# Patient Record
Sex: Female | Born: 1940 | Race: White | Hispanic: No | Smoking: Former smoker
Health system: Southern US, Community
[De-identification: ages and names within clinical notes are randomized; demographics above are authoritative.]

## PROBLEM LIST (undated history)

## (undated) DIAGNOSIS — H9190 Unspecified hearing loss, unspecified ear: Secondary | ICD-10-CM

## (undated) DIAGNOSIS — C801 Malignant (primary) neoplasm, unspecified: Secondary | ICD-10-CM

## (undated) DIAGNOSIS — M1990 Unspecified osteoarthritis, unspecified site: Secondary | ICD-10-CM

## (undated) DIAGNOSIS — R51 Headache: Secondary | ICD-10-CM

## (undated) DIAGNOSIS — M5030 Other cervical disc degeneration, unspecified cervical region: Secondary | ICD-10-CM

## (undated) DIAGNOSIS — K760 Fatty (change of) liver, not elsewhere classified: Secondary | ICD-10-CM

## (undated) DIAGNOSIS — H269 Unspecified cataract: Secondary | ICD-10-CM

## (undated) DIAGNOSIS — I10 Essential (primary) hypertension: Secondary | ICD-10-CM

## (undated) HISTORY — PX: CATARACT EXTRACTION, BILATERAL: SHX1313

## (undated) HISTORY — PX: OTHER SURGICAL HISTORY: SHX169

## (undated) HISTORY — PX: BREAST LUMPECTOMY: SHX2

## (undated) HISTORY — PX: COLONOSCOPY: SHX174

## (undated) HISTORY — PX: ABDOMINAL HYSTERECTOMY: SHX81

## (undated) HISTORY — PX: HEMORRHOID SURGERY: SHX153

## (undated) HISTORY — PX: BACK SURGERY: SHX140

---

## 2000-03-17 ENCOUNTER — Encounter: Payer: Self-pay | Admitting: Family Medicine

## 2000-03-17 ENCOUNTER — Encounter: Admission: RE | Admit: 2000-03-17 | Discharge: 2000-03-17 | Payer: Self-pay | Admitting: Family Medicine

## 2000-03-18 ENCOUNTER — Encounter: Payer: Self-pay | Admitting: Family Medicine

## 2000-03-18 ENCOUNTER — Encounter: Admission: RE | Admit: 2000-03-18 | Discharge: 2000-03-18 | Payer: Self-pay | Admitting: Family Medicine

## 2003-01-24 ENCOUNTER — Emergency Department (HOSPITAL_COMMUNITY): Admission: EM | Admit: 2003-01-24 | Discharge: 2003-01-24 | Payer: Self-pay | Admitting: Emergency Medicine

## 2003-03-23 ENCOUNTER — Ambulatory Visit (HOSPITAL_COMMUNITY): Admission: RE | Admit: 2003-03-23 | Discharge: 2003-03-23 | Payer: Self-pay | Admitting: Family Medicine

## 2004-01-28 ENCOUNTER — Ambulatory Visit (HOSPITAL_COMMUNITY): Admission: RE | Admit: 2004-01-28 | Discharge: 2004-01-28 | Payer: Self-pay | Admitting: Gastroenterology

## 2005-09-06 ENCOUNTER — Encounter: Admission: RE | Admit: 2005-09-06 | Discharge: 2005-09-06 | Payer: Self-pay | Admitting: Family Medicine

## 2005-09-07 ENCOUNTER — Encounter: Admission: RE | Admit: 2005-09-07 | Discharge: 2005-09-07 | Payer: Self-pay | Admitting: Family Medicine

## 2006-01-18 ENCOUNTER — Inpatient Hospital Stay (HOSPITAL_COMMUNITY): Admission: RE | Admit: 2006-01-18 | Discharge: 2006-01-24 | Payer: Self-pay | Admitting: Neurosurgery

## 2006-07-19 ENCOUNTER — Ambulatory Visit (HOSPITAL_COMMUNITY): Admission: RE | Admit: 2006-07-19 | Discharge: 2006-07-19 | Payer: Self-pay | Admitting: Neurosurgery

## 2006-11-16 ENCOUNTER — Encounter: Admission: RE | Admit: 2006-11-16 | Discharge: 2006-11-16 | Payer: Self-pay | Admitting: Family Medicine

## 2007-09-19 IMAGING — CR DG CERVICAL SPINE 1V
1 series · 1 of 1 positions shown · non-contrast
Comparison: Intraoperative exam performed 01/18/06.

CLINICAL DATA: Postoperative pain.  
LATERAL VIEW OF THE CERVICAL SPINE ? 01/20/06:

[view not recorded]
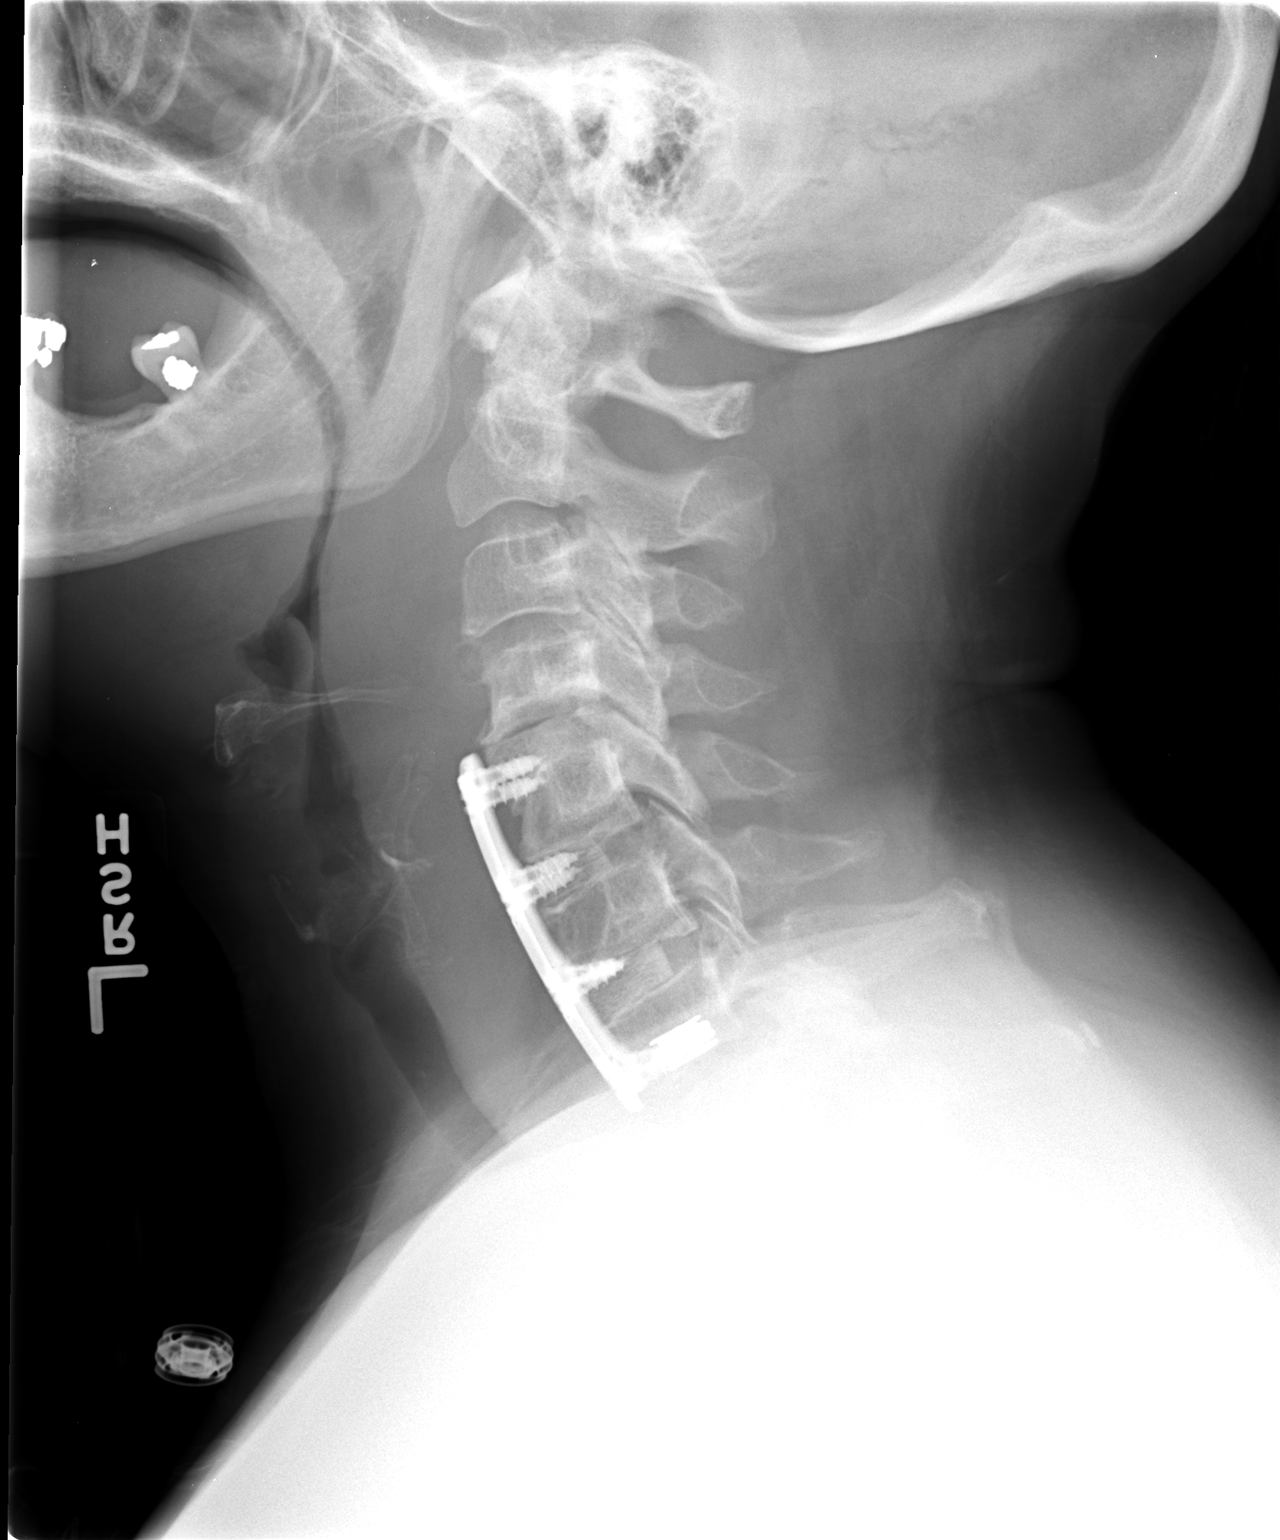

[1 of 1 positions shown; findings below may reference images not displayed]

FINDINGS: Interbody bony plug has been placed at the C4-5, C5-6 and C6-7 level.  There is an anterior plate and screws in place spanning from the C4-5 to the lower C7 level.  The upper screws traverse through the interbody bony plug at the C4-5 level and the upper aspect of the C5 vertebra.  The middle screws traverse the C5-6 bony plug and C6-7 bony plug/inferior C6 end plate.  Lower screws covered by overlying metallic structure but appear to be in the inferior aspect of C7 vertebra.  
Marked prevertebral soft tissue swelling measuring 2.9 cm at the C3 level and 2.2 cm at the C6 level.  This is compressing the adjacent hypopharynx.  This is suggestive of postoperative hemorrhage and/or infection.  Results discussed with Dr. Jeanmichel at the time of imaging.
IMPRESSION: Postsurgical changes C4 to C7 as described above with prominent prevertebral soft tissue swelling.  Question postoperative hemorrhage versus infection?

## 2010-06-19 NOTE — H&P (Signed)
Sierra [name], Sierra [name] NO.:  [id number]   MEDICAL RECORD NO.:  [id number]          PATIENT TYPE:  INP   LOCATION:  3013                         FACILITY:  [facility]   PHYSICIAN:  Payton Doughty, M.D.      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   HISTORY OF PRESENT ILLNESS:  A 70 year old right-handed white lady, who  starting in the August had the onset of neck, left shoulder, and arm  pain, numbness in the ring finger and left hand, persistent until now.  MR demonstrates spondylytic disease and she is admitted for a anterior  decompression effusion.   PAST MEDICAL HISTORY:  Remarkable for posterior decompression by Dr.  Jerline Pain in 1978 at C5-6 and C6-7.  Medical history is benign.   MEDICATIONS:  1. She uses Elavil 50 mg at night.  2. Zocor 40 mg a day.  3. Inderal 80 mg a day for migraines.  4. Vicodin on a p.r.n. basis.  5. Flexeril on p.r.n. basis.  6. Etodolac 400 mg b.i.d. for pain.   ALLERGIES:  None.   PAST SURGICAL HISTORY:  Includes also 2 lumbar disks.   SOCIAL HISTORY:  Does not smoke, does not drink, and retired from [employer].   FAMILY HISTORY:  Mom is 70, in good health.  Father's history is not  given.   REVIEW OF SYSTEMS:  Remarkable for tenderness, balance disturbance, arm  weakness, arm pain, neck pain, and fainting spells.   PHYSICAL EXAMINATION:  HEENT:  Within normal limits.  NECK:  She has limited range of motion of her neck, which causes  discomfort.  CHEST:  Clear.  CARDIAC:  Regular rate and rhythm.  ABDOMEN:  Nontender.  No hepatosplenomegaly.  EXTREMITIES:  Without clubbing or cyanosis.  Peripheral pulses are good.  GU:  Deferred.  NEUROLOGIC:  She is awake, alert, and oriented.  Her cranial nerves are  intact.  She has episodic give-way weakness.  Basically has  full  strength in the upper extremities, save for left triceps, it is 5/-5.  Sensory deficit described in the left C8 distribution.  Reflexes are  absent in the upper extremities and nonmonopathic in the lower.  Paulene Floor' is negative.   MRI and plain films shows spondylytic disease with a slight swollen neck  deformity at 4-5, 5-6 and 6-7.  On sagittal images on the left side,  there is significant spur at 6-7, slightly smaller one at 5-6.   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  Because of swan-neck deformity, degenerative change at the C3 levels, 4-  5, 5-6-, and 6-7, would need to be decompressed and fused, the goal  being to get her back in lordosis.  The risks and benefits of this  approach have discussed with her and she wished to proceed.           ______________________________  Payton Doughty, M.D.  MWR/MEDQ  D:  01/18/2006  T:  01/18/2006  Job:  [id number]

## 2010-06-19 NOTE — Discharge Summary (Signed)
NAMESUJATA, Sierra [name]                   ACCOUNT NO.:  [id number]   MEDICAL RECORD NO.:  [id number]          PATIENT TYPE:  INP   LOCATION:  3013                         FACILITY:  [facility]   PHYSICIAN:  Coletta Memos, M.D.     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   PROCEDURES:  1. Anterior cervical decompression C4-5, C7 done January 18, 2006 by      Dr. Trey Sailors.  2. Cervical wound exploration secondary to neck hematoma on January 20, 2006.   [name] was admitted on January 18, 2006 where she underwent an  anterior cervical decompression and arthrodesis secondary to pain that  she had in the neck, left shoulder, arm pain and numbness in the ring  finger of the left hand which was persistent.  MRI showed compression  and spondylosis.  She had a left C7 radiculopathy.  She also has swan  neck deformity and degenerative changes at three levels.  It was felt  that she should undergo an anterior procedure which she did.  Postoperatively, she did well but did have a neck hematoma, had some  problems swallowing, had some problems with the anxiousness when  breathing.  She was taken back to the operating room on the 20th and  underwent a cervical exploration for hematoma removal.  Postoperatively,  she is doing very well.  Her pain is improved.  She is swallowing  better.  Her wound is clean, dry and no signs of infection.  She had a  drain removed on Sunday, January 23, 2006.  She will be discharged  home.           ______________________________  Coletta Memos, M.D.     KC/MEDQ  D:  01/24/2006  T:  01/24/2006  Job:  [id number]

## 2010-06-19 NOTE — Op Note (Signed)
Sierra [name], Sierra [name] NO.:  [id number]   MEDICAL RECORD NO.:  [id number]          PATIENT TYPE:  INP   LOCATION:  3013                         FACILITY:  [facility]   PHYSICIAN:  Payton Doughty, M.D.      DATE OF BIRTH:  [birth year]   DATE OF PROCEDURE:  01/20/2006  DATE OF DISCHARGE:                               OPERATIVE REPORT   PREOPERATIVE DIAGNOSIS:  Neck mass.   POSTOPERATIVE DIAGNOSIS:  Small neck hematoma.   SURGEON:  Payton Doughty, M.D.   ANESTHESIA:  General endotracheal. Prepped with Betadine and scrubbed  with alcohol wipe.   COMPLICATIONS:  None.   PROCEDURE:  Exploration of anterior cervical decompression and fusion.   INDICATIONS FOR PROCEDURE:  This is a 70 year old girl who had a CDF  done 2 days ago and had complaints of dysphagia and lateral C-spine x-  ray demonstrated some neck mass.   DESCRIPTION OF PROCEDURE:  Taken to the operating room, smoothly  anesthetized and intubated, placed supine on the operating table.  Following shave, prep, draped in the usual sterile fashion.  The old  skin incision was reopened.  The exploration down to the prevertebral  space revealed a small amount of hematoma.  It was evacuated.  It was  not solid.  Wound was irrigated.  Hemostasis assured.  The esophagus was  carefully inspected and found to have no new lesions.  There was some  swelling of the hypopharynx.  The wound was copiously irrigated.  Hemostasis once again assured and the platysma and subcutaneous tissues  were reapproximated with 3-0 Vicryl interrupted fashion.  The skin was  closed with 4-0 nylon in simple running fashion.  Betadine Telfa  dressing was applied and made occlusive with OpSite.  The patient  returned to recovery room in good condition.  A Jackson-Pratt drain was  placed in prevertebral space and exited by separate incision.  Betadine  Telfa dressing was applied and the patient placed back in an Aspen  collar and returned to  recovery room in good condition.           ______________________________  Payton Doughty, M.D.     MWR/MEDQ  D:  01/20/2006  T:  01/21/2006  Job:  [id number]

## 2010-06-19 NOTE — Op Note (Signed)
NAMEDEYNA, CARBON NO.:  [id number]   MEDICAL RECORD NO.:  [id number]          PATIENT TYPE:  INP   LOCATION:  2899                         FACILITY:  [facility]   PHYSICIAN:  Payton Doughty, M.D.      DATE OF BIRTH:  26-Mar-1940   DATE OF PROCEDURE:  01/18/2006  DATE OF DISCHARGE:                               OPERATIVE REPORT   PREOPERATIVE DIAGNOSIS:  Spondylosis, C4-5, C5-6, C6-7.   POSTOPERATIVE DIAGNOSIS:  Spondylosis, C4-5, C5-6, C6-7.   PROCEDURE:  C4-5, C5-6, C6-7 anterior cervical decompression and fusion  with a Reflex Hybrid plate.   SURGEON:  Payton Doughty, M.D.   ANESTHESIA:  General endotracheal.   __________  Alcohol wipe.   COMPLICATIONS:  None.   NURSE ASSISTANT:  [place].   DOCTOR ASSISTANT:  Hewitt Shorts, M.D.   DESCRIPTION OF PROCEDURE:  This is a 70 year old girl with severe  cervical spondylosis.  Taken to operating room, smoothly __________  intubated and placed supine on the operating table with the neck  extended in the halter head traction.  Following shave, prep, and drape  in the usual sterile fashion, the skin was incised paralleling the  sternocleidomastoid muscle starting 2 fingerbreadths below the carotid  tubercle for a distance of about 8 cm.  The platysma was identified,  elevated, divided, and undermined.  The sternocleidomastoid was  identified.  Medial dissection revealed the carotid artery tracked  laterally to the left.  The trachea and esophagus were retracted  laterally to the right, exposing the bones of the anterior cervical  spine.  A marker was placed.  Intraoperative x-ray obtained to confirm  correctness of level.  After confirming correctness of level, diskectomy  was carried out at 4-5, 5-6, and 6-7 under gross observation.  The  operating microscope was then brought in.  We used microdissection  technique to remove the bone spur, divide the posterior longitudinal  ligament, and explore the neural  foramina bilaterally.  5-6 was the  worst, 6-7 was second worst, and 4-5 was the least affected, although  significantly encumbered.  Following complete decompression and  exploration of both neural foramen at each level, a 7-mm bone graft was  fashioned from patellar allograft and tapped into place.  A 51-mm Reflex  Hybrid plate was then placed with two screws in C4,  two in C5, two C6,  and two in C7.  The screws were 12 mm.  Intraoperative x-ray showed good  placement of bone grafts, plate, and screws.  The wound was irrigated.  Hemostasis assured.  A 7-mm Jackson-Pratt drain was placed in  the prevertebral space.  A 3-0 Vicryl was used to close the platysma and  subcutaneous tissue, and the skin was closed with 4-0 Vicryl in running  subcuticular fashion.  Benzoin and Steri-Strips were placed with  occlusive Telfa and OpSite, and the patient was placed in an Aspen  collar and returned to the recovery room in good condition.  ,           ______________________________  Payton Doughty, M.D.  MWR/MEDQ  D:  01/18/2006  T:  01/18/2006  Job:  [id number]

## 2011-02-25 DIAGNOSIS — M25519 Pain in unspecified shoulder: Secondary | ICD-10-CM | POA: Diagnosis not present

## 2011-02-25 DIAGNOSIS — Z23 Encounter for immunization: Secondary | ICD-10-CM | POA: Diagnosis not present

## 2011-02-25 DIAGNOSIS — B009 Herpesviral infection, unspecified: Secondary | ICD-10-CM | POA: Diagnosis not present

## 2011-03-30 DIAGNOSIS — M25519 Pain in unspecified shoulder: Secondary | ICD-10-CM | POA: Diagnosis not present

## 2011-03-31 DIAGNOSIS — M25519 Pain in unspecified shoulder: Secondary | ICD-10-CM | POA: Diagnosis not present

## 2011-04-09 DIAGNOSIS — M25519 Pain in unspecified shoulder: Secondary | ICD-10-CM | POA: Diagnosis not present

## 2011-04-16 DIAGNOSIS — M545 Low back pain: Secondary | ICD-10-CM | POA: Diagnosis not present

## 2011-04-16 DIAGNOSIS — Z23 Encounter for immunization: Secondary | ICD-10-CM | POA: Diagnosis not present

## 2011-04-16 DIAGNOSIS — E7800 Pure hypercholesterolemia, unspecified: Secondary | ICD-10-CM | POA: Diagnosis not present

## 2011-04-16 DIAGNOSIS — Z13220 Encounter for screening for lipoid disorders: Secondary | ICD-10-CM | POA: Diagnosis not present

## 2011-04-16 DIAGNOSIS — Z136 Encounter for screening for cardiovascular disorders: Secondary | ICD-10-CM | POA: Diagnosis not present

## 2011-04-16 DIAGNOSIS — E119 Type 2 diabetes mellitus without complications: Secondary | ICD-10-CM | POA: Diagnosis not present

## 2011-04-16 DIAGNOSIS — M25519 Pain in unspecified shoulder: Secondary | ICD-10-CM | POA: Diagnosis not present

## 2011-04-16 DIAGNOSIS — I10 Essential (primary) hypertension: Secondary | ICD-10-CM | POA: Diagnosis not present

## 2011-05-11 DIAGNOSIS — M25519 Pain in unspecified shoulder: Secondary | ICD-10-CM | POA: Diagnosis not present

## 2011-05-14 DIAGNOSIS — M25519 Pain in unspecified shoulder: Secondary | ICD-10-CM | POA: Diagnosis not present

## 2011-05-18 DIAGNOSIS — M25519 Pain in unspecified shoulder: Secondary | ICD-10-CM | POA: Diagnosis not present

## 2011-05-21 DIAGNOSIS — M25519 Pain in unspecified shoulder: Secondary | ICD-10-CM | POA: Diagnosis not present

## 2011-05-25 DIAGNOSIS — M25519 Pain in unspecified shoulder: Secondary | ICD-10-CM | POA: Diagnosis not present

## 2011-05-28 DIAGNOSIS — M25519 Pain in unspecified shoulder: Secondary | ICD-10-CM | POA: Diagnosis not present

## 2011-06-02 ENCOUNTER — Other Ambulatory Visit: Payer: Self-pay | Admitting: Surgical

## 2011-06-02 MED ORDER — BUPIVACAINE LIPOSOME 1.3 % IJ SUSP: 20.0000 mL | Freq: Once | INTRAMUSCULAR | Status: DC

## 2011-06-03 MED ORDER — FENTANYL CITRATE 0.05 MG/ML IJ SOLN
INTRAMUSCULAR | Status: AC
  Filled 2011-06-03: qty 2

## 2011-06-03 MED ORDER — ACETAMINOPHEN 10 MG/ML IV SOLN
INTRAVENOUS | Status: AC
  Filled 2011-06-03: qty 100

## 2011-06-11 ENCOUNTER — Encounter (HOSPITAL_COMMUNITY): Payer: Self-pay | Admitting: Pharmacy Technician

## 2011-06-17 ENCOUNTER — Encounter (HOSPITAL_COMMUNITY): Payer: Self-pay

## 2011-06-17 ENCOUNTER — Ambulatory Visit (HOSPITAL_COMMUNITY)
Admission: RE | Admit: 2011-06-17 | Discharge: 2011-06-17 | Disposition: A | Discharge status: Home / Self Care | Payer: Medicare Other | Source: Ambulatory Visit | Attending: Orthopedic Surgery | Admitting: Orthopedic Surgery

## 2011-06-17 ENCOUNTER — Encounter (HOSPITAL_COMMUNITY)
Admission: RE | Admit: 2011-06-17 | Discharge: 2011-06-17 | Disposition: A | Discharge status: Home / Self Care | Payer: Medicare Other | Source: Ambulatory Visit | Attending: Orthopedic Surgery | Admitting: Orthopedic Surgery

## 2011-06-17 DIAGNOSIS — M25819 Other specified joint disorders, unspecified shoulder: Secondary | ICD-10-CM | POA: Insufficient documentation

## 2011-06-17 DIAGNOSIS — S43429A Sprain of unspecified rotator cuff capsule, initial encounter: Secondary | ICD-10-CM | POA: Diagnosis not present

## 2011-06-17 DIAGNOSIS — X58XXXA Exposure to other specified factors, initial encounter: Secondary | ICD-10-CM | POA: Insufficient documentation

## 2011-06-17 DIAGNOSIS — Z01811 Encounter for preprocedural respiratory examination: Secondary | ICD-10-CM | POA: Diagnosis not present

## 2011-06-17 DIAGNOSIS — Z01810 Encounter for preprocedural cardiovascular examination: Secondary | ICD-10-CM | POA: Diagnosis not present

## 2011-06-17 DIAGNOSIS — Z01812 Encounter for preprocedural laboratory examination: Secondary | ICD-10-CM | POA: Diagnosis not present

## 2011-06-17 DIAGNOSIS — J984 Other disorders of lung: Secondary | ICD-10-CM | POA: Insufficient documentation

## 2011-06-17 DIAGNOSIS — Z01818 Encounter for other preprocedural examination: Secondary | ICD-10-CM | POA: Insufficient documentation

## 2011-06-17 HISTORY — DX: Malignant (primary) neoplasm, unspecified: C80.1

## 2011-06-17 HISTORY — DX: Unspecified osteoarthritis, unspecified site: M19.90

## 2011-06-17 HISTORY — DX: Essential (primary) hypertension: I10

## 2011-06-17 HISTORY — DX: Headache: R51

## 2011-06-17 LAB — BASIC METABOLIC PANEL
Calcium: 8.9 mg/dL (ref 8.4–10.5)
GFR calc Af Amer: 72 mL/min — ABNORMAL LOW (ref >90)
GFR calc non Af Amer: 62 mL/min — ABNORMAL LOW (ref >90)
Glucose, Bld: 123 mg/dL — ABNORMAL HIGH (ref 70–99)
Potassium: 4.8 mEq/L (ref 3.5–5.1)
Sodium: 144 mEq/L (ref 135–145)

## 2011-06-17 LAB — URINALYSIS, ROUTINE W REFLEX MICROSCOPIC
Bilirubin Urine: NEGATIVE
Glucose, UA: NEGATIVE mg/dL
Hgb urine dipstick: NEGATIVE
Nitrite: POSITIVE — AB
Protein, ur: NEGATIVE mg/dL
Specific Gravity, Urine: 1.027 (ref 1.005–1.030)
Urobilinogen, UA: 1 mg/dL (ref 0.0–1.0)
pH: 7.5 (ref 5.0–8.0)

## 2011-06-17 LAB — CBC
Hemoglobin: 13.4 g/dL (ref 12.0–15.0)
MCH: 30.5 pg (ref 26.0–34.0)
Platelets: 278 10*3/uL (ref 150–400)
RBC: 4.4 MIL/uL (ref 3.87–5.11)

## 2011-06-17 LAB — URINE MICROSCOPIC-ADD ON

## 2011-06-17 LAB — SURGICAL PCR SCREEN: MRSA, PCR: NEGATIVE

## 2011-06-17 MED ORDER — CEFAZOLIN SODIUM 1-5 GM-% IV SOLN: 1.0000 g | INTRAVENOUS | Status: DC

## 2011-06-17 NOTE — Patient Instructions (Signed)
20 Sierra [name]  06/17/2011   Your procedure is scheduled on:  06/23/11 1045am-1215pm  Report to [facility] at 0815 AM.  Call this number if you have problems the morning of surgery: [phone]   Remember:   Do not eat food:After Midnight.  May have clear liquids:until Midnight .   Take these medicines the morning of surgery with A SIP OF WATER   Do not wear jewelry, make-up or nail polish.  Do not wear lotions, powders, or perfumes.   Do not shave 48 hours prior to surgery.  Do not bring valuables to the hospital.  Contacts, dentures or bridgework may not be worn into surgery.  Leave suitcase in the car. After surgery it may be brought to your room.  For patients admitted to the hospital, checkout time is 11:00 AM the day of discharge.    Special Instructions: CHG Shower Use Special Wash: 1/2 bottle night before surgery and 1/2 bottle morning of surgery. shower chin to toes with CHG.  Wash face and private parts with regular soap.    Please read over the following fact sheets that you were given: MRSA Information, Incentive Spirometry Fact Sheet, coughing and deep breathing exercise, leg exercises

## 2011-06-17 NOTE — Pre-Procedure Instructions (Signed)
06/17/11 Faxed note for Dr Darrelyn Hillock to note abnormal CXR  and U/A with micro results. Confirmation received by fax.

## 2011-06-18 ENCOUNTER — Other Ambulatory Visit: Payer: Self-pay | Admitting: Orthopedic Surgery

## 2011-06-18 DIAGNOSIS — M25519 Pain in unspecified shoulder: Secondary | ICD-10-CM

## 2011-06-21 NOTE — H&P (Signed)
Sierra [name] DOB: 02-08-1940  Chief Complaint: right shoulder pain  History of Present Illness The patient is a 71 year old female who is scheduled for an open right rotator cuff repair with acromionectomy with possible closed manipulation by Dr. Darrelyn Hillock at [facility] on Wednesday Jun 23, 2011. The patient is being followed for their right shoulder impingement. They are 15 weeks out from when symptoms began. She was helping her aunt reposition when she felt that her aunt was going to fall. When she tried to catch her, she felt some immediate pain in the right shoulder that radiated to the right arm. The patient denied numbness and tingling in the right arm at this point. She denied neck pain. She has a history of cervical fusion that was performed years ago. The patient has seen her primary care physician Dr. Clovis Riley for this. He placed her on an antiinflammatory and put her into a sling which she states has helped some but she was still in severe pain. MRI showed impingement with partial rotator cuff tear. Conservative treatment was given including therapy and cortisone injections but she continued to have pain.    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    Family History Hypertension. mother and father   Social History Living situation. live alone Marital status. widowed Illicit drug use. no Drug/Alcohol Rehab (Previously). no Exercise. Exercises daily; does running / walking Tobacco / smoke exposure. no Tobacco use. former smoker; smoke(d) 3 or more pack(s) per day Current work status. retired Aeronautical engineer. yes Alcohol use. current drinker; only occasionally per week Drug/Alcohol Rehab (Currently). no Children. 3   Pregnancy / Birth History Pregnant. no   Past Surgical History Neck Disc  Surgery Spinal Surgery Appendectomy Hemorrhoidectomy Hysterectomy. partial (non-cancerous)   Review of Systems General:Not Present- Chills, Fever, Night Sweats, Appetite Loss, Fatigue, Feeling sick, Weight Gain and Weight Loss. Skin:Not Present- Itching, Rash, Skin Color Changes, Ulcer, Psoriasis and Change in Hair or Nails. HEENT:Present- Ringing in the Ears. Not Present- Sensitivity to light, Hearing problems and Nose Bleed. Neck:Not Present- Swollen Glands and Neck Mass. Respiratory:Not Present- Snoring, Chronic Cough, Bloody sputum and Dyspnea. Cardiovascular:Not Present- Shortness of Breath, Chest Pain, Swelling of Extremities, Leg Cramps and Palpitations. Gastrointestinal:Not Present- Bloody Stool, Heartburn, Abdominal Pain, Vomiting, Nausea and Incontinence of Stool. Female Genitourinary:Not Present- Blood in Urine, Menstrual Irregularities, Frequency, Incontinence and Nocturia. Musculoskeletal:Not Present- Muscle Weakness, Muscle Pain, Joint Stiffness, Joint Swelling, Joint Pain and Back Pain. Neurological:Not Present- Tingling, Numbness, Burning, Tremor, Headaches and Dizziness. Psychiatric:Not Present- Anxiety, Depression and Memory Loss. Endocrine:Not Present- Cold Intolerance, Heat Intolerance, Excessive hunger and Excessive Thirst. Hematology:Not Present- Abnormal Bleeding, Anemia, Blood Clots and Easy Bruising.   Vitals Weight: 185 lb Height: 66.75 in Body Surface Area: 1.99 m Body Mass Index: 29.19 kg/m Pulse: 83 (Regular) BP: 159/90   Physical Exam She is alert and oriented in no acute distress. She is tender over the anterior portion of the shoulder. The [facility] joint is intact. No tenderness over the scapula or the cervical spine. No masses or tumors palpated. Mild pain with cervical ROM. The patient has very limited range of motion about 30 degrees of flexion and abduction and she is unable to hold this against resistance. Normal sensation  and circulation in the upper extremities. Elbows have normal painless range of motion as well as the hands and fingers. The left shoulder has normal  painless range of motion. Lungs clear to auscultation. Heart normal sinus rhythm with no murmur. Oral cavity is negative. Neck supple, no bruit. Abdomen soft, nontender. Bowel sounds active.    Assessment and Plans Right rotator cuff tear and shoulder impingement Right shoulder rotator cuff repair with possible graft, open acromionectomy, closed manipulation.       Dimitri Ped, PA-C

## 2011-06-22 ENCOUNTER — Ambulatory Visit
Admission: RE | Admit: 2011-06-22 | Discharge: 2011-06-22 | Disposition: A | Discharge status: Home / Self Care | Payer: Medicare Other | Source: Ambulatory Visit | Attending: Orthopedic Surgery | Admitting: Orthopedic Surgery

## 2011-06-22 DIAGNOSIS — M25519 Pain in unspecified shoulder: Secondary | ICD-10-CM

## 2011-06-22 DIAGNOSIS — J9819 Other pulmonary collapse: Secondary | ICD-10-CM | POA: Diagnosis not present

## 2011-06-22 DIAGNOSIS — J984 Other disorders of lung: Secondary | ICD-10-CM | POA: Diagnosis not present

## 2011-06-22 DIAGNOSIS — R918 Other nonspecific abnormal finding of lung field: Secondary | ICD-10-CM | POA: Diagnosis not present

## 2011-06-23 ENCOUNTER — Encounter (HOSPITAL_COMMUNITY): Payer: Self-pay | Admitting: Registered Nurse

## 2011-06-23 ENCOUNTER — Ambulatory Visit (HOSPITAL_COMMUNITY)
Admission: RE | Admit: 2011-06-23 | Discharge: 2011-06-24 | Disposition: A | Discharge status: Home / Self Care | Payer: Medicare Other | Source: Ambulatory Visit | Attending: Orthopedic Surgery | Admitting: Orthopedic Surgery

## 2011-06-23 ENCOUNTER — Ambulatory Visit (HOSPITAL_COMMUNITY): Payer: Medicare Other | Admitting: Registered Nurse

## 2011-06-23 ENCOUNTER — Encounter (HOSPITAL_COMMUNITY): Payer: Self-pay

## 2011-06-23 ENCOUNTER — Encounter (HOSPITAL_COMMUNITY)
Admission: RE | Disposition: A | Discharge status: Home / Self Care | Payer: Self-pay | Source: Ambulatory Visit | Attending: Orthopedic Surgery

## 2011-06-23 DIAGNOSIS — E7800 Pure hypercholesterolemia, unspecified: Secondary | ICD-10-CM | POA: Insufficient documentation

## 2011-06-23 DIAGNOSIS — E119 Type 2 diabetes mellitus without complications: Secondary | ICD-10-CM | POA: Diagnosis not present

## 2011-06-23 DIAGNOSIS — S43429A Sprain of unspecified rotator cuff capsule, initial encounter: Secondary | ICD-10-CM | POA: Diagnosis not present

## 2011-06-23 DIAGNOSIS — M7500 Adhesive capsulitis of unspecified shoulder: Secondary | ICD-10-CM | POA: Diagnosis not present

## 2011-06-23 DIAGNOSIS — M25819 Other specified joint disorders, unspecified shoulder: Secondary | ICD-10-CM | POA: Diagnosis not present

## 2011-06-23 DIAGNOSIS — S43499A Other sprain of unspecified shoulder joint, initial encounter: Secondary | ICD-10-CM | POA: Diagnosis not present

## 2011-06-23 DIAGNOSIS — Z79899 Other long term (current) drug therapy: Secondary | ICD-10-CM | POA: Diagnosis not present

## 2011-06-23 DIAGNOSIS — S46819A Strain of other muscles, fascia and tendons at shoulder and upper arm level, unspecified arm, initial encounter: Secondary | ICD-10-CM | POA: Diagnosis not present

## 2011-06-23 DIAGNOSIS — X58XXXA Exposure to other specified factors, initial encounter: Secondary | ICD-10-CM | POA: Insufficient documentation

## 2011-06-23 DIAGNOSIS — Z9889 Other specified postprocedural states: Secondary | ICD-10-CM

## 2011-06-23 HISTORY — PX: SHOULDER CLOSED REDUCTION: SHX1051

## 2011-06-23 HISTORY — PX: SHOULDER OPEN ROTATOR CUFF REPAIR: SHX2407

## 2011-06-23 LAB — GLUCOSE, CAPILLARY
Glucose-Capillary: 106 mg/dL — ABNORMAL HIGH (ref 70–99)
Glucose-Capillary: 128 mg/dL — ABNORMAL HIGH (ref 70–99)

## 2011-06-23 SURGERY — REPAIR, ROTATOR CUFF, OPEN
Anesthesia: General | Site: Shoulder | Laterality: Right

## 2011-06-23 MED ORDER — CEFAZOLIN SODIUM-DEXTROSE 2-3 GM-% IV SOLR
2.0000 g | Freq: Once | INTRAVENOUS | Status: AC
  Administered 2011-06-23: 2 g via INTRAVENOUS

## 2011-06-23 MED ORDER — ONDANSETRON HCL 4 MG PO TABS: 4.0000 mg | ORAL_TABLET | Freq: Four times a day (QID) | ORAL | Status: DC | PRN

## 2011-06-23 MED ORDER — PHENOL 1.4 % MT LIQD
1.0000 | OROMUCOSAL | Status: DC | PRN
  Filled 2011-06-23: qty 177

## 2011-06-23 MED ORDER — METFORMIN HCL ER 500 MG PO TB24
1000.0000 mg | ORAL_TABLET | Freq: Two times a day (BID) | ORAL | Status: DC
  Administered 2011-06-24: 1000 mg via ORAL
  Filled 2011-06-23 (×4): qty 2

## 2011-06-23 MED ORDER — HYDROMORPHONE HCL PF 1 MG/ML IJ SOLN
INTRAMUSCULAR | Status: AC
  Administered 2011-06-23: 1 mg via INTRAVENOUS
  Filled 2011-06-23: qty 2

## 2011-06-23 MED ORDER — DEXTROSE 5 % IV SOLN
500.0000 mg | Freq: Four times a day (QID) | INTRAVENOUS | Status: DC | PRN
  Administered 2011-06-23: 500 mg via INTRAVENOUS
  Filled 2011-06-23: qty 5

## 2011-06-23 MED ORDER — AMITRIPTYLINE HCL 50 MG PO TABS
50.0000 mg | ORAL_TABLET | Freq: Every day | ORAL | Status: DC
  Administered 2011-06-23: 50 mg via ORAL
  Filled 2011-06-23 (×2): qty 1

## 2011-06-23 MED ORDER — FENTANYL CITRATE 0.05 MG/ML IJ SOLN
INTRAMUSCULAR | Status: DC | PRN
  Administered 2011-06-23 (×2): 25 ug via INTRAVENOUS
  Administered 2011-06-23 (×3): 50 ug via INTRAVENOUS

## 2011-06-23 MED ORDER — HYDROCODONE-ACETAMINOPHEN 5-325 MG PO TABS
1.0000 | ORAL_TABLET | ORAL | Status: DC | PRN
  Administered 2011-06-23 – 2011-06-24 (×2): 2 via ORAL
  Filled 2011-06-23 (×2): qty 2

## 2011-06-23 MED ORDER — THROMBIN 5000 UNITS EX SOLR
CUTANEOUS | Status: AC
  Filled 2011-06-23: qty 10000

## 2011-06-23 MED ORDER — THROMBIN 5000 UNITS EX SOLR
CUTANEOUS | Status: DC | PRN
  Administered 2011-06-23: 5000 [IU] via TOPICAL

## 2011-06-23 MED ORDER — SUCCINYLCHOLINE CHLORIDE 20 MG/ML IJ SOLN
INTRAMUSCULAR | Status: DC | PRN
  Administered 2011-06-23: 100 mg via INTRAVENOUS

## 2011-06-23 MED ORDER — ACETAMINOPHEN 325 MG PO TABS: 650.0000 mg | ORAL_TABLET | Freq: Four times a day (QID) | ORAL | Status: DC | PRN

## 2011-06-23 MED ORDER — CISATRACURIUM BESYLATE (PF) 10 MG/5ML IV SOLN
INTRAVENOUS | Status: DC | PRN
  Administered 2011-06-23: 8 mg via INTRAVENOUS

## 2011-06-23 MED ORDER — LIDOCAINE HCL (CARDIAC) 20 MG/ML IV SOLN
INTRAVENOUS | Status: DC | PRN
  Administered 2011-06-23: 20 mg via INTRAVENOUS

## 2011-06-23 MED ORDER — NEOSTIGMINE METHYLSULFATE 1 MG/ML IJ SOLN
INTRAMUSCULAR | Status: DC | PRN
  Administered 2011-06-23: 4 mg via INTRAVENOUS

## 2011-06-23 MED ORDER — PROPRANOLOL HCL ER 80 MG PO CP24
80.0000 mg | ORAL_CAPSULE | Freq: Every day | ORAL | Status: DC
  Administered 2011-06-23: 80 mg via ORAL
  Filled 2011-06-23 (×2): qty 1

## 2011-06-23 MED ORDER — PROMETHAZINE HCL 25 MG/ML IJ SOLN: 6.2500 mg | INTRAMUSCULAR | Status: DC | PRN

## 2011-06-23 MED ORDER — MEPERIDINE HCL 50 MG/ML IJ SOLN: 6.2500 mg | INTRAMUSCULAR | Status: DC | PRN

## 2011-06-23 MED ORDER — ONDANSETRON HCL 4 MG/2ML IJ SOLN
INTRAMUSCULAR | Status: DC | PRN
  Administered 2011-06-23: 4 mg via INTRAVENOUS

## 2011-06-23 MED ORDER — BISACODYL 10 MG RE SUPP: 10.0000 mg | Freq: Every day | RECTAL | Status: DC | PRN

## 2011-06-23 MED ORDER — SODIUM CHLORIDE 0.9 % IR SOLN
Status: DC | PRN
  Administered 2011-06-23: 11:00:00

## 2011-06-23 MED ORDER — HYDROMORPHONE HCL PF 1 MG/ML IJ SOLN
0.5000 mg | INTRAMUSCULAR | Status: DC | PRN
  Administered 2011-06-23 – 2011-06-24 (×6): 1 mg via INTRAVENOUS
  Filled 2011-06-23 (×6): qty 1

## 2011-06-23 MED ORDER — ONDANSETRON HCL 4 MG/2ML IJ SOLN
4.0000 mg | Freq: Four times a day (QID) | INTRAMUSCULAR | Status: DC | PRN
  Administered 2011-06-23: 4 mg via INTRAVENOUS
  Filled 2011-06-23: qty 2

## 2011-06-23 MED ORDER — EPHEDRINE SULFATE 50 MG/ML IJ SOLN
INTRAMUSCULAR | Status: DC | PRN
  Administered 2011-06-23 (×2): 5 mg via INTRAVENOUS

## 2011-06-23 MED ORDER — CEFAZOLIN SODIUM-DEXTROSE 2-3 GM-% IV SOLR
INTRAVENOUS | Status: AC
  Filled 2011-06-23: qty 50

## 2011-06-23 MED ORDER — LACTATED RINGERS IV SOLN
INTRAVENOUS | Status: DC
  Administered 2011-06-23 (×2): 100 mL/h via INTRAVENOUS

## 2011-06-23 MED ORDER — METHOCARBAMOL 500 MG PO TABS: 500.0000 mg | ORAL_TABLET | Freq: Four times a day (QID) | ORAL | Status: DC | PRN

## 2011-06-23 MED ORDER — PROPOFOL 10 MG/ML IV EMUL
INTRAVENOUS | Status: DC | PRN
  Administered 2011-06-23: 170 mg via INTRAVENOUS

## 2011-06-23 MED ORDER — OXYCODONE-ACETAMINOPHEN 5-325 MG PO TABS
1.0000 | ORAL_TABLET | ORAL | Status: DC | PRN
  Administered 2011-06-23 (×2): 1 via ORAL
  Administered 2011-06-24: 2 via ORAL
  Filled 2011-06-23: qty 1
  Filled 2011-06-23: qty 2
  Filled 2011-06-23: qty 1

## 2011-06-23 MED ORDER — BUPIVACAINE LIPOSOME 1.3 % IJ SUSP
20.0000 mL | Freq: Once | INTRAMUSCULAR | Status: AC
  Administered 2011-06-23: 20 mL
  Filled 2011-06-23: qty 20

## 2011-06-23 MED ORDER — ACETAMINOPHEN 10 MG/ML IV SOLN
INTRAVENOUS | Status: DC | PRN
  Administered 2011-06-23: 1000 mg via INTRAVENOUS

## 2011-06-23 MED ORDER — GLYCOPYRROLATE 0.2 MG/ML IJ SOLN
INTRAMUSCULAR | Status: DC | PRN
  Administered 2011-06-23: 0.6 mg via INTRAVENOUS

## 2011-06-23 MED ORDER — LACTATED RINGERS IV SOLN: INTRAVENOUS | Status: DC

## 2011-06-23 MED ORDER — INSULIN ASPART 100 UNIT/ML ~~LOC~~ SOLN
0.0000 [IU] | Freq: Three times a day (TID) | SUBCUTANEOUS | Status: DC
  Administered 2011-06-24: 3 [IU] via SUBCUTANEOUS
  Administered 2011-06-24: 2 [IU] via SUBCUTANEOUS

## 2011-06-23 MED ORDER — ACETAMINOPHEN 650 MG RE SUPP: 650.0000 mg | Freq: Four times a day (QID) | RECTAL | Status: DC | PRN

## 2011-06-23 MED ORDER — CEFAZOLIN SODIUM 1-5 GM-% IV SOLN
INTRAVENOUS | Status: DC | PRN
  Administered 2011-06-23: 2 g via INTRAVENOUS

## 2011-06-23 MED ORDER — LACTATED RINGERS IV SOLN
INTRAVENOUS | Status: DC
  Administered 2011-06-23 (×2): via INTRAVENOUS

## 2011-06-23 MED ORDER — ACETAMINOPHEN 10 MG/ML IV SOLN
INTRAVENOUS | Status: AC
  Filled 2011-06-23: qty 100

## 2011-06-23 MED ORDER — POLYETHYLENE GLYCOL 3350 17 G PO PACK
17.0000 g | PACK | Freq: Every day | ORAL | Status: DC | PRN
  Filled 2011-06-23: qty 1

## 2011-06-23 MED ORDER — CEFAZOLIN SODIUM 1-5 GM-% IV SOLN
1.0000 g | Freq: Four times a day (QID) | INTRAVENOUS | Status: AC
  Administered 2011-06-23 – 2011-06-24 (×3): 1 g via INTRAVENOUS
  Filled 2011-06-23 (×3): qty 50

## 2011-06-23 MED ORDER — FLEET ENEMA 7-19 GM/118ML RE ENEM: 1.0000 | ENEMA | Freq: Once | RECTAL | Status: AC | PRN

## 2011-06-23 MED ORDER — HYDROMORPHONE HCL PF 1 MG/ML IJ SOLN
0.2500 mg | INTRAMUSCULAR | Status: DC | PRN
  Administered 2011-06-23 (×4): 0.5 mg via INTRAVENOUS

## 2011-06-23 MED ORDER — MENTHOL 3 MG MT LOZG
1.0000 | LOZENGE | OROMUCOSAL | Status: DC | PRN
  Filled 2011-06-23: qty 9

## 2011-06-23 SURGICAL SUPPLY — 56 items
ANCH SUT 2 5.5 BABSR ASCP (Orthopedic Implant) ×1 IMPLANT
ANCHOR PEEK ZIP 5.5 NDL NO2 (Orthopedic Implant) ×1 IMPLANT
BAG SPEC THK2 15X12 ZIP CLS (MISCELLANEOUS) ×1
BAG ZIPLOCK 12X15 (MISCELLANEOUS) ×2 IMPLANT
BLADE OSCILLATING/SAGITTAL (BLADE) ×2
BLADE SW THK.38XMED LNG THN (BLADE) ×1 IMPLANT
BNDG COHESIVE 6X5 TAN NS LF (GAUZE/BANDAGES/DRESSINGS) IMPLANT
BUR OVAL CARBIDE 4.0 (BURR) ×2 IMPLANT
CLEANER TIP ELECTROSURG 2X2 (MISCELLANEOUS) ×2 IMPLANT
CLOTH BEACON ORANGE TIMEOUT ST (SAFETY) ×2 IMPLANT
CLSR STERI-STRIP ANTIMIC 1/2X4 (GAUZE/BANDAGES/DRESSINGS) ×1 IMPLANT
DRAPE POUCH INSTRU U-SHP 10X18 (DRAPES) ×2 IMPLANT
DRSG EMULSION OIL 3X3 NADH (GAUZE/BANDAGES/DRESSINGS) ×2 IMPLANT
DRSG PAD ABDOMINAL 8X10 ST (GAUZE/BANDAGES/DRESSINGS) ×3 IMPLANT
DURAPREP 26ML APPLICATOR (WOUND CARE) ×2 IMPLANT
ELECT REM PT RETURN 9FT ADLT (ELECTROSURGICAL) ×2
ELECTRODE REM PT RTRN 9FT ADLT (ELECTROSURGICAL) ×1 IMPLANT
FLOSEAL 10ML (HEMOSTASIS) IMPLANT
GLOVE BIOGEL PI IND STRL 8 (GLOVE) ×1 IMPLANT
GLOVE BIOGEL PI IND STRL 8.5 (GLOVE) ×1 IMPLANT
GLOVE BIOGEL PI INDICATOR 8 (GLOVE) ×1
GLOVE BIOGEL PI INDICATOR 8.5 (GLOVE) ×1
GLOVE ECLIPSE 8.0 STRL XLNG CF (GLOVE) ×4 IMPLANT
GLOVE SURG SS PI 6.5 STRL IVOR (GLOVE) ×4 IMPLANT
GOWN PREVENTION PLUS LG XLONG (DISPOSABLE) ×4 IMPLANT
GOWN STRL REIN XL XLG (GOWN DISPOSABLE) ×4 IMPLANT
KIT BASIN OR (CUSTOM PROCEDURE TRAY) ×2 IMPLANT
MANIFOLD NEPTUNE II (INSTRUMENTS) ×2 IMPLANT
NDL HYPO 25X1 1.5 SAFETY (NEEDLE) ×1 IMPLANT
NDL MA TROC 1/2 (NEEDLE) IMPLANT
NEEDLE HYPO 25X1 1.5 SAFETY (NEEDLE) ×2 IMPLANT
NEEDLE MA TROC 1/2 (NEEDLE) IMPLANT
NS IRRIG 1000ML POUR BTL (IV SOLUTION) IMPLANT
PACK SHOULDER CUSTOM OPM052 (CUSTOM PROCEDURE TRAY) ×2 IMPLANT
PASSER SUT SWANSON 36MM LOOP (INSTRUMENTS) IMPLANT
PATCH TISSUE MEND 3X3CM (Orthopedic Implant) ×1 IMPLANT
POSITIONER SURGICAL ARM (MISCELLANEOUS) ×2 IMPLANT
SLING ARM IMMOBILIZER LRG (SOFTGOODS) ×2 IMPLANT
SLING ARM IMMOBILIZER MED (SOFTGOODS) ×1 IMPLANT
SPONGE GAUZE 4X4 12PLY (GAUZE/BANDAGES/DRESSINGS) ×1 IMPLANT
SPONGE SURGIFOAM ABS GEL 100 (HEMOSTASIS) IMPLANT
STAPLER VISISTAT 35W (STAPLE) ×2 IMPLANT
STRIP CLOSURE SKIN 1/2X4 (GAUZE/BANDAGES/DRESSINGS) ×2 IMPLANT
SUCTION FRAZIER 12FR DISP (SUCTIONS) ×2 IMPLANT
SUT BONE WAX W31G (SUTURE) ×2 IMPLANT
SUT ETHIBOND NAB CT1 #1 30IN (SUTURE) ×3 IMPLANT
SUT MNCRL AB 4-0 PS2 18 (SUTURE) ×2 IMPLANT
SUT VIC AB 0 CT1 27 (SUTURE) ×2
SUT VIC AB 0 CT1 27XBRD ANTBC (SUTURE) ×1 IMPLANT
SUT VIC AB 1 CT1 27 (SUTURE) ×2
SUT VIC AB 1 CT1 27XBRD ANTBC (SUTURE) ×2 IMPLANT
SUT VIC AB 2-0 CT1 27 (SUTURE)
SUT VIC AB 2-0 CT1 27XBRD (SUTURE) IMPLANT
SYR CONTROL 10ML LL (SYRINGE) ×2 IMPLANT
TAPE CLOTH SURG 4X10 WHT LF (GAUZE/BANDAGES/DRESSINGS) ×1 IMPLANT
TOWEL OR 17X26 10 PK STRL BLUE (TOWEL DISPOSABLE) ×4 IMPLANT

## 2011-06-23 NOTE — Progress Notes (Signed)
Pt. Alert and oriented. Rt. Arm in a sling, dressing to Rt. Shoulder is clean and dry. Ice applied to Rt. Shoulder x2. Repositioned off back. Medicated x1 for pain with dilaudid 1mg . Pt. Sleeping at present

## 2011-06-23 NOTE — Anesthesia Preprocedure Evaluation (Addendum)
Anesthesia Evaluation  Patient identified by MRN, date of birth, ID band Patient awake    Reviewed: Allergy & Precautions, H&P , NPO status , Patient's Chart, lab work & pertinent test results  Airway       Dental  (+) Poor Dentition and Missing   Pulmonary neg pulmonary ROS, former smoker         Cardiovascular hypertension, Pt. on medications negative cardio ROS      Neuro/Psych negative neurological ROS  negative psych ROS   GI/Hepatic negative GI ROS, Neg liver ROS,   Endo/Other  negative endocrine ROSDiabetes mellitus-, Type 2, Oral Hypoglycemic Agents  Renal/GU negative Renal ROS  negative genitourinary   Musculoskeletal negative musculoskeletal ROS (+)   Abdominal   Peds negative pediatric ROS (+)  Hematology negative hematology ROS (+)   Anesthesia Other Findings Upper front bridge right  Reproductive/Obstetrics negative OB ROS                           Anesthesia Physical Anesthesia Plan  ASA: II  Anesthesia Plan: General   Post-op Pain Management:    Induction: Intravenous  Airway Management Planned: Oral ETT  Additional Equipment:   Intra-op Plan:   Post-operative Plan: Extubation in OR  Informed Consent: I have reviewed the patients History and Physical, chart, labs and discussed the procedure including the risks, benefits and alternatives for the proposed anesthesia with the patient or authorized representative who has indicated his/her understanding and acceptance.   Dental advisory given  Plan Discussed with: CRNA  Anesthesia Plan Comments:         Anesthesia Quick Evaluation

## 2011-06-23 NOTE — Anesthesia Postprocedure Evaluation (Signed)
  Anesthesia Post-op Note  Patient: Sierra [name]  Procedure(s) Performed: Procedure(s) (LRB): ROTATOR CUFF REPAIR SHOULDER OPEN (Right) CLOSED MANIPULATION SHOULDER (Right)  Patient Location: PACU  Anesthesia Type: General  Level of Consciousness: awake and alert   Airway and Oxygen Therapy: Patient Spontanous Breathing  Post-op Pain: mild  Post-op Assessment: Post-op Vital signs reviewed, Patient's Cardiovascular Status Stable, Respiratory Function Stable, Patent Airway and No signs of Nausea or vomiting  Post-op Vital Signs: stable  Complications: No apparent anesthesia complications

## 2011-06-23 NOTE — Interval H&P Note (Signed)
History and Physical Interval Note:  06/23/2011 9:52 AM  Sierra [name]  has presented today for surgery, with the diagnosis of Right Shoulder Rotator Cuff Tear with Impingment  The various methods of treatment have been discussed with the patient and family. After consideration of risks, benefits and other options for treatment, the patient has consented to  Procedure(s) (LRB): ROTATOR CUFF REPAIR SHOULDER OPEN (Right) CLOSED MANIPULATION SHOULDER (Right) as a surgical intervention .  The patients' history has been reviewed, patient examined, no change in status, stable for surgery.  I have reviewed the patients' chart and labs.  Questions were answered to the patient's satisfaction.     Leen Tworek A

## 2011-06-23 NOTE — Transfer of Care (Signed)
Immediate Anesthesia Transfer of Care Note  Patient: Sierra [name]  Procedure(s) Performed: Procedure(s) (LRB): ROTATOR CUFF REPAIR SHOULDER OPEN (Right) CLOSED MANIPULATION SHOULDER (Right)  Patient Location: PACU  Anesthesia Type: General  Level of Consciousness: awake and sedated  Airway & Oxygen Therapy: Patient Spontanous Breathing and Patient connected to face mask oxygen  Post-op Assessment: Report given to PACU RN and Post -op Vital signs reviewed and stable  Post vital signs: Reviewed and stable  Complications: No apparent anesthesia complications

## 2011-06-23 NOTE — Brief Op Note (Signed)
06/23/2011  12:30 PM  PATIENT:  Sierra [name]  71 y.o. female  PRE-OPERATIVE DIAGNOSIS:  Right Shoulder Rotator Cuff Tear with Impingment  POST-OPERATIVE DIAGNOSIS:  Right shoulder rotator cuff tear with impingement  PROCEDURE:  Procedure(s) (LRB): ROTATOR CUFF REPAIR SHOULDER OPEN (Right) CLOSED MANIPULATION SHOULDER (Right)  SURGEON:  Surgeon(s) and Role:    * Jacki Cones, MD - Primary  PHYSICIAN ASSISTANT: Dimitri Ped PA  }   ANESTHESIA:   general  EBL:  Total I/O In: 1000 [I.V.:1000] Out: -   BLOOD ADMINISTERED:none  DRAINS: none   LOCAL MEDICATIONS USED:  BUPIVICAINE 20cc  SPECIMEN:  No Specimen  DISPOSITION OF SPECIMEN:  N/A  COUNTS:  YES  TOURNIQUET:  * No tourniquets in log *  DICTATION: .Other Dictation: Dictation Number [id number]  PLAN OF CARE: Admit for overnight observation  PATIENT DISPOSITION:  PACU - hemodynamically stable.   Delay start of Pharmacological VTE agent (>24hrs) due to surgical blood loss or risk of bleeding: yes

## 2011-06-24 LAB — GLUCOSE, CAPILLARY

## 2011-06-24 MED ORDER — OXYCODONE-ACETAMINOPHEN 5-325 MG PO TABS: 1.0000 | ORAL_TABLET | ORAL | Status: AC | PRN

## 2011-06-24 MED ORDER — METHOCARBAMOL 500 MG PO TABS: 500.0000 mg | ORAL_TABLET | Freq: Four times a day (QID) | ORAL | Status: AC | PRN

## 2011-06-24 NOTE — Op Note (Signed)
Sierra [name], Sierra [name] NO.:  [id number]  MEDICAL RECORD NO.:  [id number]  LOCATION:  1302                         FACILITY:  [facility]  PHYSICIAN:  Georges Lynch. Saran Laviolette, M.D.DATE OF BIRTH:  Feb 26, 1940  DATE OF PROCEDURE:  06/23/2011 DATE OF DISCHARGE:                              OPERATIVE REPORT   SURGEON:  Georges Lynch. Darrelyn Hillock, M.D.  ASSISTANT:  Dimitri Ped, [place].  PREOPERATIVE DIAGNOSES: 1. 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 2. Severe impingement syndrome, right shoulder. 3. Adhesive capsulitis, right shoulder.  POSTOPERATIVE DIAGNOSES: 1. 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 2. Severe impingement syndrome, right shoulder. 3. Adhesive capsulitis, right shoulder.  OPERATIONS: 1. Closed manipulation, right shoulder. 2. Open acromionectomy, right shoulder. 3. Repair of a complete tear of the rotator cuff, right shoulder. 4. TissueMend graft, right shoulder. 5. One Stryker anchor, right shoulder.  We anchored the soft tissue in     that area around the biceps tendon, as well as the anchor was used     to provide for the graft stabilization.  PROCEDURE:  Under general anesthesia, appropriate time-out was carried out 1st to do a closed manipulation of the right shoulder.  The appropriate right arm was also marked in the holding area.  I then carried out a gentle closed manipulation of the right shoulder to break up the adhesions.  Following that, a sterile prep and draping of the right shoulder was carried out.  Prior to making an incision, a 2nd time- out was carried out.  Once again, the appropriate right arm was marked in the holding area.  An incision was made over the anterior aspect of the right shoulder and she did receive 2 g of IV Ancef.  The surgeon once again was Dr. Darrelyn Hillock, assistant, Dimitri Ped, [place].  Once the incision was made, I then separated the deltoid tendon from the acromion in the usual  fashion.  I split the small proximal part of the deltoid muscle.  I went down and identified the soft tissue structures and I did an excision of the subdeltoid bursa.  Also at this time, we identified a large hole in the rotator cuff tendon.  This was identified and at this point, we protected the remaining cuff with the Bennett retractor.  I utilized the oscillating saw and did a partial acromionectomy and acromioplasty with the oscillating saw and the bur.  At this time, I then thoroughly irrigated out the area and I bone waxed the undersurface of the acromion.  Once we reestablished the subacromial space, an anchor was placed down around the soft tissue structures of the biceps tendon for anchor purposes of soft tissue and around the biceps, as well as the anchor was also used to provide extra sutures for the graft.  Following that, after we did a primary repair of the rotator cuff tendon with Ethibond suture, I then reinforced it with a TissueMend graft and I did utilize the remaining part of the anchor sutures.  We had nice fixation, nice coverage of the graft, and stabilization.  We then thoroughly irrigated out the area and I inserted thrombin-soaked Gelfoam.  I closed the wound in layers in the usual fashion.  I did inject 20 mL of Exparel into the wound site.  The patient was placed in a shoulder immobilizer.          ______________________________ Georges Lynch Darrelyn Hillock, M.D.     RAG/MEDQ  D:  06/23/2011  T:  06/24/2011  Job:  [id number]

## 2011-06-24 NOTE — Progress Notes (Signed)
OT Note:  Evaluation and education completed.  Please refer to paper copy in shadow chart.  Recommend follow up with MD to progress therapy/activity.  No further OT needs at this time. 06/24/2011 Martie Round, OTR/L Pager: [phone]

## 2011-06-24 NOTE — Progress Notes (Signed)
Subjective: 1 Day Post-Op Procedure(s) (LRB): ROTATOR CUFF REPAIR SHOULDER OPEN (Right) CLOSED MANIPULATION SHOULDER (Right) Patient reports pain as moderate.   Patient seen in rounds without Dr. Darrelyn Hillock. Patient is well, but has had some minor complaints of decreased appetite and pain in the right shoulder, requiring pain medications. She reports that she did get some sleep last night but does not feel very rested. She denies chest pain and shortness of breath. Plan is to go Home after hospital stay.  Objective: Vital signs in last 24 hours: Temp:  [97 F (36.1 C)-99.3 F (37.4 C)] 99.3 F (37.4 C) (05/23 0530) Pulse Rate:  [59-84] 84  (05/23 0530) Resp:  [8-20] 17  (05/23 0530) BP: (109-167)/(44-95) 155/79 mmHg (05/23 0530) SpO2:  [20 %-100 %] 97 % (05/23 0530) Weight:  [94.5 kg (208 lb 5.4 oz)] 94.5 kg (208 lb 5.4 oz) (05/23 0530)  Intake/Output from previous day:  Intake/Output Summary (Last 24 hours) at 06/24/11 0735 Last data filed at 06/24/11 0500  Gross per 24 hour  Intake 2460.33 ml  Output   1390 ml  Net 1070.33 ml     EXAM General - Patient is Alert and Oriented Extremity - Neurologically intact Neurovascular intact Dressing/Incision - dressing C/D/I Motor Function - intact, moving hand and wrist well. Grip strength 5/5 Sensory- intact in right UE  Past Medical History  Diagnosis Date  . Hypertension   . Diabetes mellitus   . Headache     hx of migraines   . Arthritis     generalized   . Cancer     hx of cancer being removed from skin on abdomen     Assessment/Plan: 1 Day Post-Op Procedure(s) (LRB): ROTATOR CUFF REPAIR SHOULDER OPEN (Right) CLOSED MANIPULATION SHOULDER (Right) S/P rotator cuff repair, open, right   Advance diet D/C IV fluids Discharge home today after meeting with OT  DVT Prophylaxis - Aspirin 81 mg Wear sling on right shoulder at all times except showering Follow up in office in 2 weeks   Keylan Costabile LAUREN 06/24/2011,  7:35 AM

## 2011-07-02 ENCOUNTER — Encounter (HOSPITAL_COMMUNITY): Payer: Self-pay | Admitting: Orthopedic Surgery

## 2011-07-02 NOTE — OR Nursing (Signed)
Chart amended 07/02/11 @ 806. Change to chart was addition of Procedure End Time.

## 2011-10-21 DIAGNOSIS — E7800 Pure hypercholesterolemia, unspecified: Secondary | ICD-10-CM | POA: Diagnosis not present

## 2011-10-21 DIAGNOSIS — I10 Essential (primary) hypertension: Secondary | ICD-10-CM | POA: Diagnosis not present

## 2011-10-21 DIAGNOSIS — R300 Dysuria: Secondary | ICD-10-CM | POA: Diagnosis not present

## 2011-10-21 DIAGNOSIS — M545 Low back pain: Secondary | ICD-10-CM | POA: Diagnosis not present

## 2011-10-21 DIAGNOSIS — N3000 Acute cystitis without hematuria: Secondary | ICD-10-CM | POA: Diagnosis not present

## 2011-10-21 DIAGNOSIS — E119 Type 2 diabetes mellitus without complications: Secondary | ICD-10-CM | POA: Diagnosis not present

## 2011-10-21 DIAGNOSIS — M25559 Pain in unspecified hip: Secondary | ICD-10-CM | POA: Diagnosis not present

## 2011-10-21 DIAGNOSIS — Z23 Encounter for immunization: Secondary | ICD-10-CM | POA: Diagnosis not present

## 2012-05-04 DIAGNOSIS — E119 Type 2 diabetes mellitus without complications: Secondary | ICD-10-CM | POA: Diagnosis not present

## 2012-05-04 DIAGNOSIS — E7800 Pure hypercholesterolemia, unspecified: Secondary | ICD-10-CM | POA: Diagnosis not present

## 2012-05-04 DIAGNOSIS — M545 Low back pain: Secondary | ICD-10-CM | POA: Diagnosis not present

## 2012-05-04 DIAGNOSIS — I10 Essential (primary) hypertension: Secondary | ICD-10-CM | POA: Diagnosis not present

## 2012-11-13 DIAGNOSIS — I10 Essential (primary) hypertension: Secondary | ICD-10-CM | POA: Diagnosis not present

## 2012-11-13 DIAGNOSIS — E7800 Pure hypercholesterolemia, unspecified: Secondary | ICD-10-CM | POA: Diagnosis not present

## 2012-11-13 DIAGNOSIS — Z1211 Encounter for screening for malignant neoplasm of colon: Secondary | ICD-10-CM | POA: Diagnosis not present

## 2012-11-13 DIAGNOSIS — Z0000 Encounter for general adult medical examination without abnormal findings: Secondary | ICD-10-CM | POA: Diagnosis not present

## 2012-11-13 DIAGNOSIS — Z23 Encounter for immunization: Secondary | ICD-10-CM | POA: Diagnosis not present

## 2012-11-13 DIAGNOSIS — E119 Type 2 diabetes mellitus without complications: Secondary | ICD-10-CM | POA: Diagnosis not present

## 2012-11-13 DIAGNOSIS — N951 Menopausal and female climacteric states: Secondary | ICD-10-CM | POA: Diagnosis not present

## 2012-11-13 DIAGNOSIS — M545 Low back pain: Secondary | ICD-10-CM | POA: Diagnosis not present

## 2012-11-29 DIAGNOSIS — E8941 Symptomatic postprocedural ovarian failure: Secondary | ICD-10-CM | POA: Diagnosis not present

## 2012-11-29 DIAGNOSIS — M810 Age-related osteoporosis without current pathological fracture: Secondary | ICD-10-CM | POA: Diagnosis not present

## 2013-02-13 IMAGING — CR DG CHEST 2V
2 series · 2 of 2 positions shown · non-contrast
Comparison: 01/14/2006.

CLINICAL DATA: Preop for right shoulder surgery.

CHEST - 2 VIEW

[w chest pa]
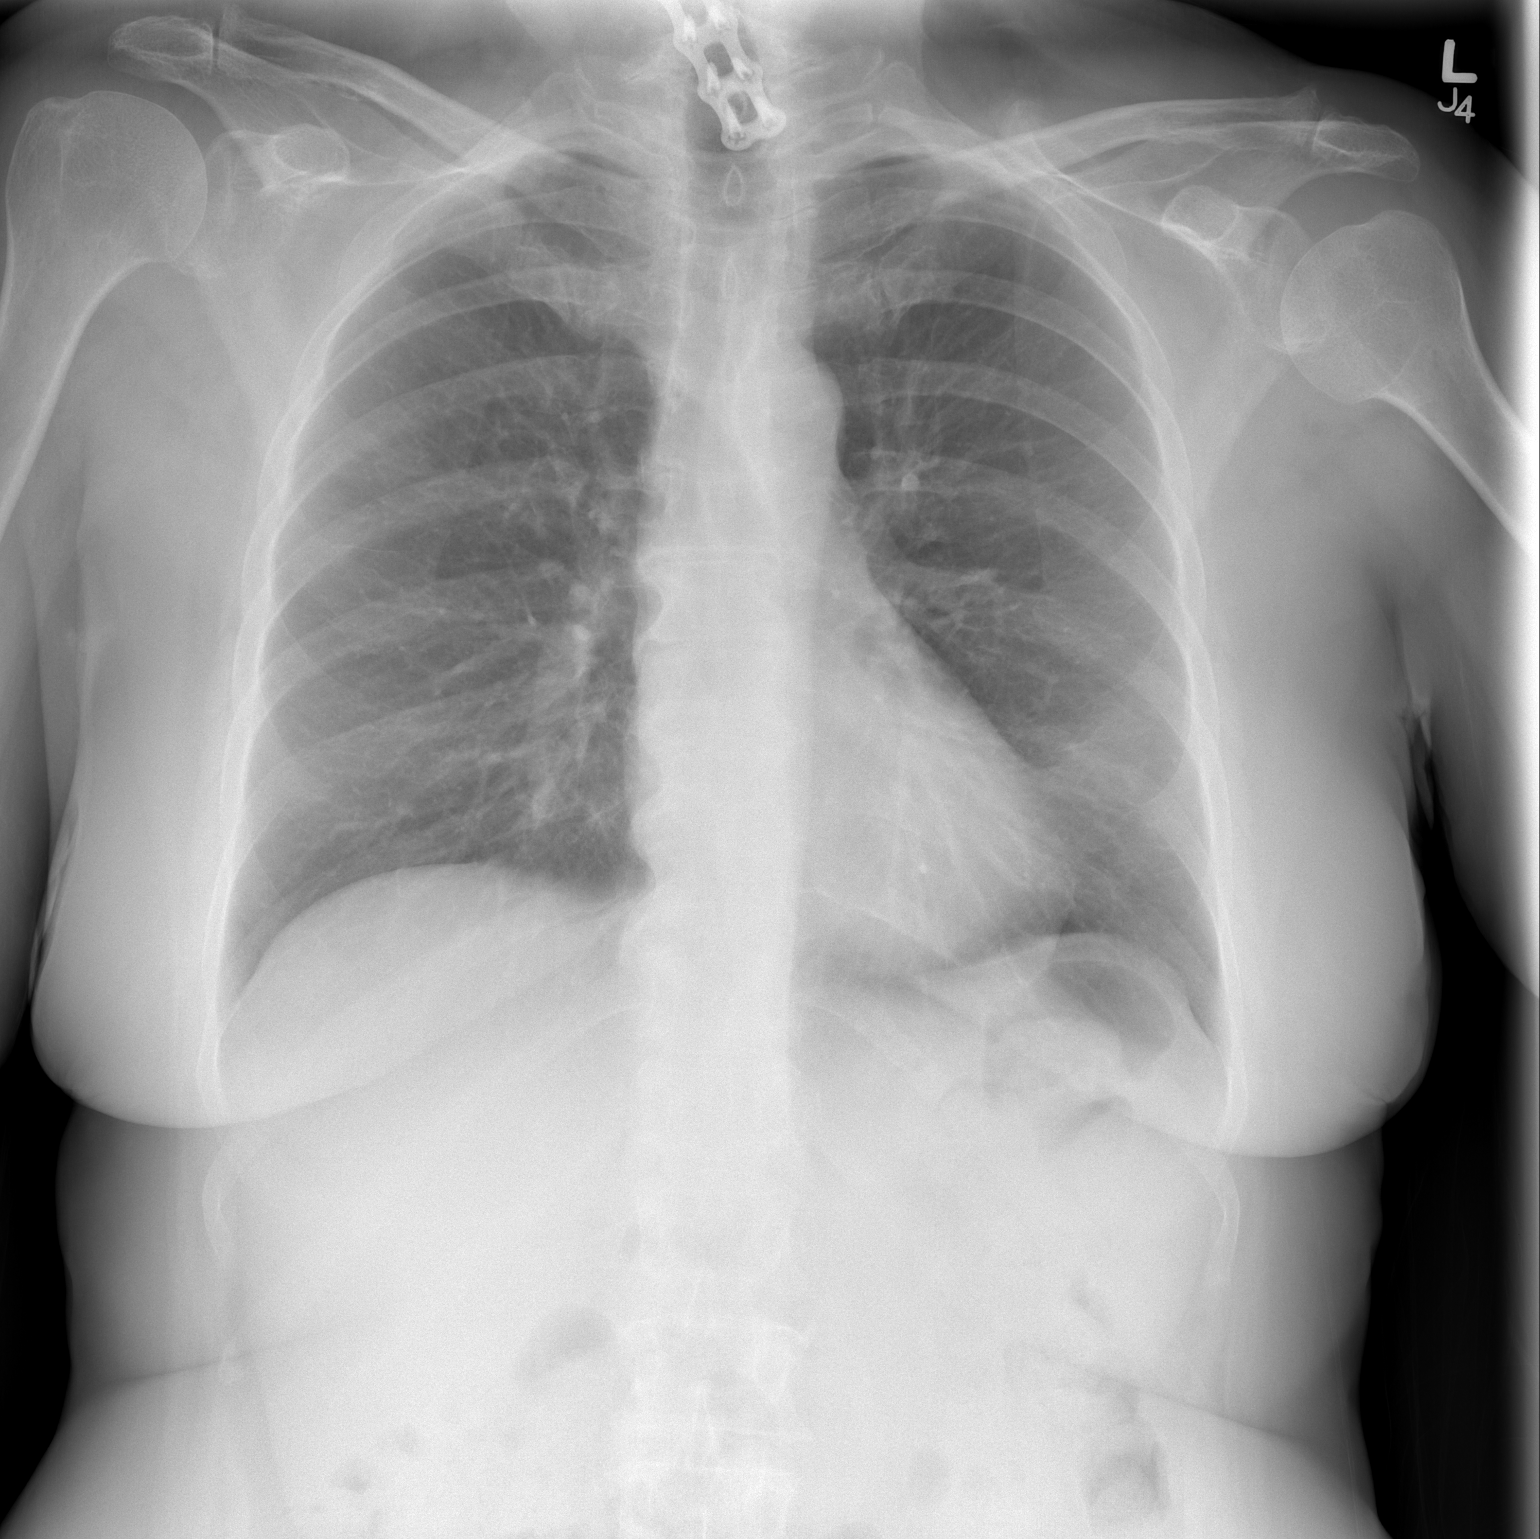

[w chest lat]
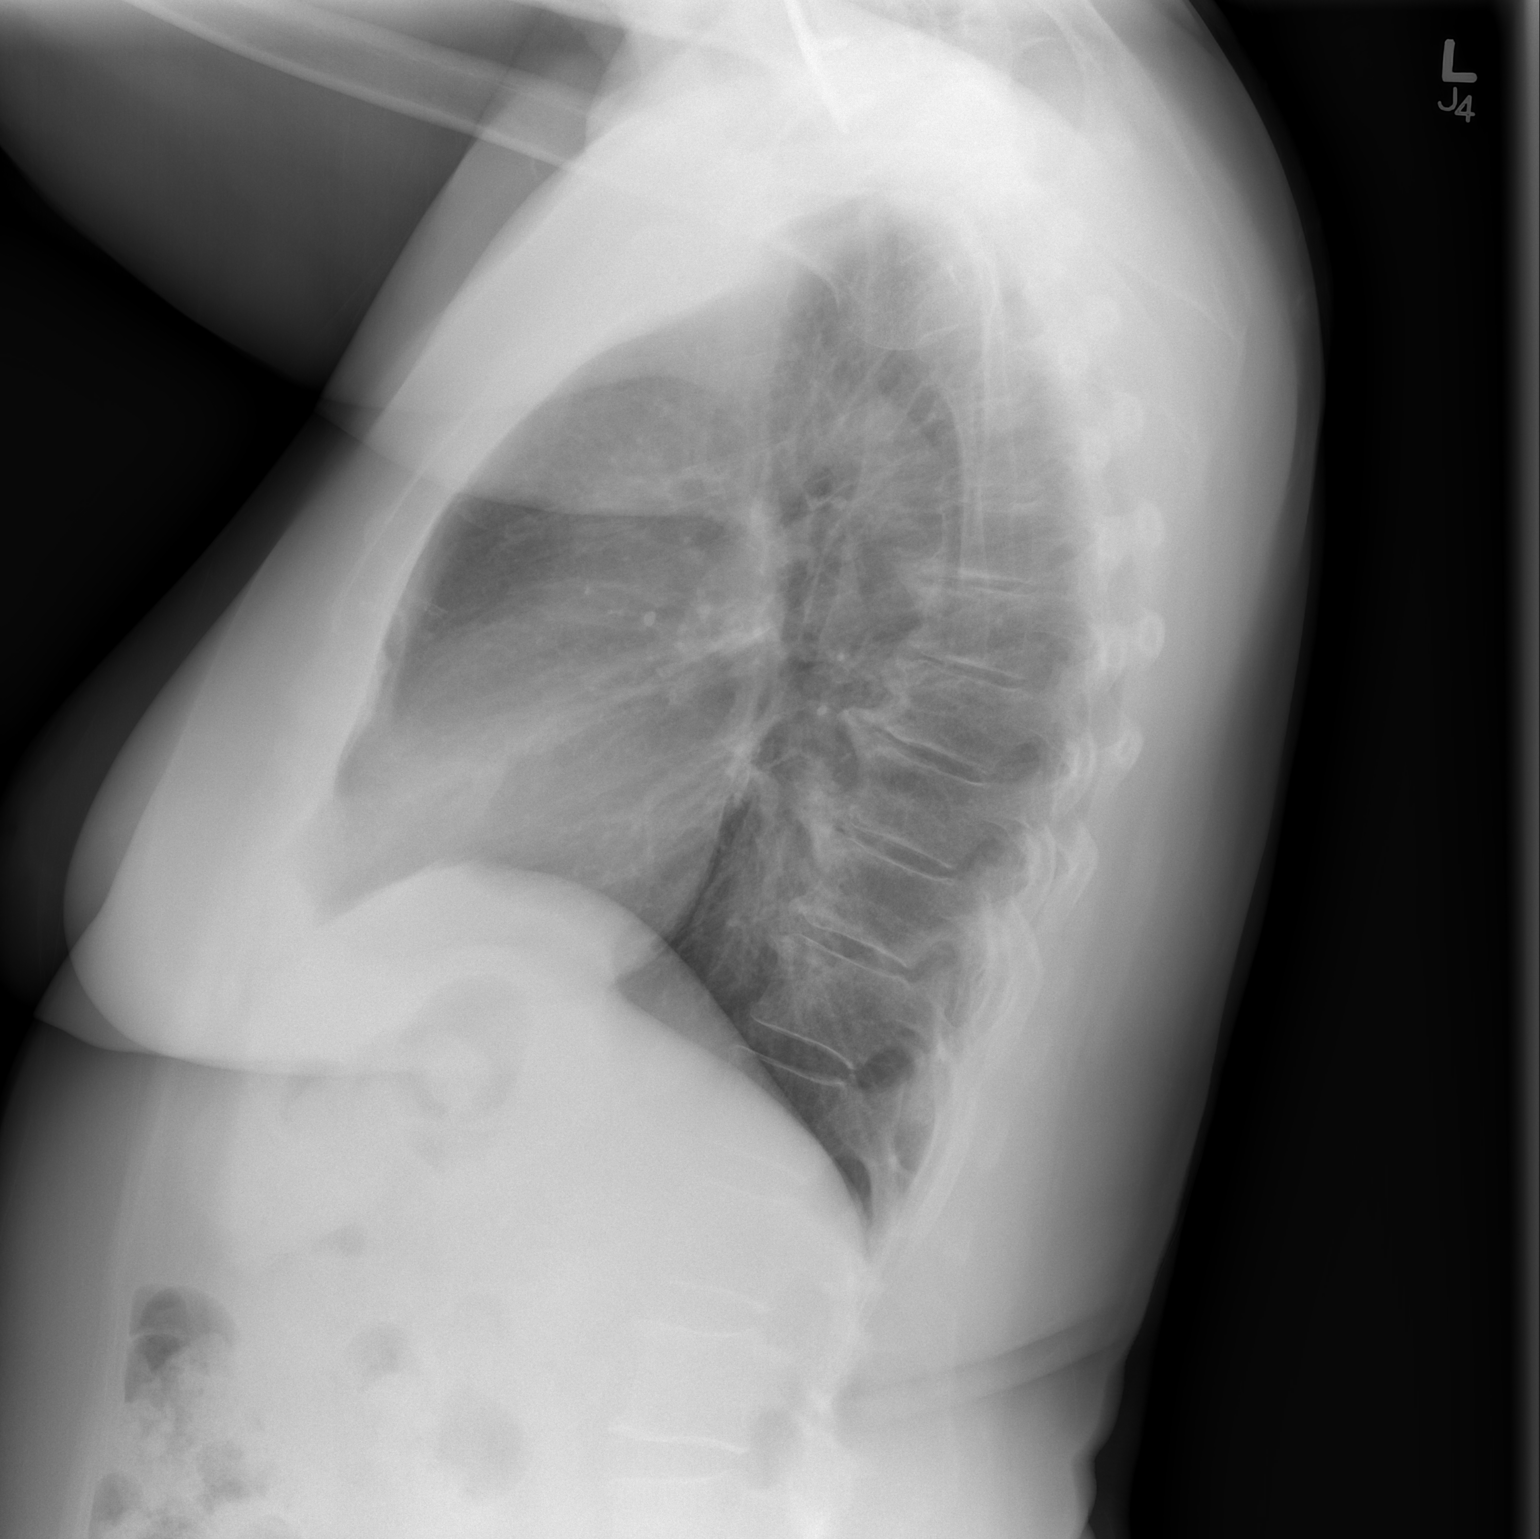

[2 of 2 positions shown; findings below may reference images not displayed]

FINDINGS: The cardiac silhouette, mediastinal and hilar contours
are within normal limits and stable.  There are mild chronic
bronchitic type lung changes but no infiltrates, edema or
effusions.  There is a vague density in the left lung, not well
seen on the lateral film.  It may be overlapping vascular and bone
shadows but cannot exclude a lung nodule.  Chest CT with contrast
is recommended for further evaluation.  The bony thorax is intact.
IMPRESSION: Ill-defined density in the left lower lung.  Recommend chest CT to
exclude a pulmonary lesion.

## 2013-03-13 ENCOUNTER — Emergency Department (HOSPITAL_COMMUNITY)
Admission: EM | Admit: 2013-03-13 | Discharge: 2013-03-14 | Disposition: A | Discharge status: Home / Self Care | Payer: Medicare Other | Attending: Emergency Medicine | Admitting: Emergency Medicine

## 2013-03-13 ENCOUNTER — Encounter (HOSPITAL_COMMUNITY): Payer: Self-pay | Admitting: Emergency Medicine

## 2013-03-13 ENCOUNTER — Emergency Department (HOSPITAL_COMMUNITY): Payer: Medicare Other

## 2013-03-13 DIAGNOSIS — Z791 Long term (current) use of non-steroidal anti-inflammatories (NSAID): Secondary | ICD-10-CM | POA: Diagnosis not present

## 2013-03-13 DIAGNOSIS — Z85828 Personal history of other malignant neoplasm of skin: Secondary | ICD-10-CM | POA: Diagnosis not present

## 2013-03-13 DIAGNOSIS — S79929A Unspecified injury of unspecified thigh, initial encounter: Secondary | ICD-10-CM

## 2013-03-13 DIAGNOSIS — G43909 Migraine, unspecified, not intractable, without status migrainosus: Secondary | ICD-10-CM | POA: Insufficient documentation

## 2013-03-13 DIAGNOSIS — Z87891 Personal history of nicotine dependence: Secondary | ICD-10-CM | POA: Diagnosis not present

## 2013-03-13 DIAGNOSIS — R197 Diarrhea, unspecified: Secondary | ICD-10-CM | POA: Diagnosis not present

## 2013-03-13 DIAGNOSIS — R05 Cough: Secondary | ICD-10-CM | POA: Insufficient documentation

## 2013-03-13 DIAGNOSIS — Z79899 Other long term (current) drug therapy: Secondary | ICD-10-CM | POA: Diagnosis not present

## 2013-03-13 DIAGNOSIS — R55 Syncope and collapse: Secondary | ICD-10-CM | POA: Diagnosis not present

## 2013-03-13 DIAGNOSIS — J3489 Other specified disorders of nose and nasal sinuses: Secondary | ICD-10-CM | POA: Insufficient documentation

## 2013-03-13 DIAGNOSIS — R112 Nausea with vomiting, unspecified: Secondary | ICD-10-CM | POA: Diagnosis not present

## 2013-03-13 DIAGNOSIS — Z7982 Long term (current) use of aspirin: Secondary | ICD-10-CM | POA: Insufficient documentation

## 2013-03-13 DIAGNOSIS — M129 Arthropathy, unspecified: Secondary | ICD-10-CM | POA: Insufficient documentation

## 2013-03-13 DIAGNOSIS — R079 Chest pain, unspecified: Secondary | ICD-10-CM | POA: Diagnosis not present

## 2013-03-13 DIAGNOSIS — R059 Cough, unspecified: Secondary | ICD-10-CM | POA: Diagnosis not present

## 2013-03-13 DIAGNOSIS — R509 Fever, unspecified: Secondary | ICD-10-CM | POA: Diagnosis not present

## 2013-03-13 DIAGNOSIS — Y939 Activity, unspecified: Secondary | ICD-10-CM | POA: Insufficient documentation

## 2013-03-13 DIAGNOSIS — K7689 Other specified diseases of liver: Secondary | ICD-10-CM | POA: Diagnosis not present

## 2013-03-13 DIAGNOSIS — S20219A Contusion of unspecified front wall of thorax, initial encounter: Secondary | ICD-10-CM | POA: Diagnosis not present

## 2013-03-13 DIAGNOSIS — E119 Type 2 diabetes mellitus without complications: Secondary | ICD-10-CM | POA: Diagnosis not present

## 2013-03-13 DIAGNOSIS — I10 Essential (primary) hypertension: Secondary | ICD-10-CM | POA: Diagnosis not present

## 2013-03-13 DIAGNOSIS — S298XXA Other specified injuries of thorax, initial encounter: Secondary | ICD-10-CM | POA: Diagnosis not present

## 2013-03-13 DIAGNOSIS — M25559 Pain in unspecified hip: Secondary | ICD-10-CM | POA: Diagnosis not present

## 2013-03-13 DIAGNOSIS — S79919A Unspecified injury of unspecified hip, initial encounter: Secondary | ICD-10-CM | POA: Insufficient documentation

## 2013-03-13 DIAGNOSIS — W19XXXA Unspecified fall, initial encounter: Secondary | ICD-10-CM

## 2013-03-13 DIAGNOSIS — S0990XA Unspecified injury of head, initial encounter: Secondary | ICD-10-CM | POA: Diagnosis not present

## 2013-03-13 DIAGNOSIS — R296 Repeated falls: Secondary | ICD-10-CM | POA: Insufficient documentation

## 2013-03-13 DIAGNOSIS — Y9289 Other specified places as the place of occurrence of the external cause: Secondary | ICD-10-CM | POA: Insufficient documentation

## 2013-03-13 LAB — CBC WITH DIFFERENTIAL/PLATELET
BASOS PCT: 1 % (ref 0–1)
Basophils Absolute: 0 10*3/uL (ref 0.0–0.1)
EOS ABS: 0.2 10*3/uL (ref 0.0–0.7)
Eosinophils Relative: 2 % (ref 0–5)
HCT: 42.9 % (ref 36.0–46.0)
HEMOGLOBIN: 14.3 g/dL (ref 12.0–15.0)
Lymphocytes Relative: 11 % — ABNORMAL LOW (ref 12–46)
Lymphs Abs: 0.9 10*3/uL (ref 0.7–4.0)
MCH: 30.5 pg (ref 26.0–34.0)
MCHC: 33.3 g/dL (ref 30.0–36.0)
MCV: 91.5 fL (ref 78.0–100.0)
Monocytes Absolute: 0.4 10*3/uL (ref 0.1–1.0)
Monocytes Relative: 4 % (ref 3–12)
NEUTROS PCT: 83 % — AB (ref 43–77)
Neutro Abs: 7.1 10*3/uL (ref 1.7–7.7)
Platelets: 226 10*3/uL (ref 150–400)
RBC: 4.69 MIL/uL (ref 3.87–5.11)
RDW: 14.2 % (ref 11.5–15.5)
WBC: 8.6 10*3/uL (ref 4.0–10.5)

## 2013-03-13 LAB — URINE MICROSCOPIC-ADD ON

## 2013-03-13 LAB — URINALYSIS, ROUTINE W REFLEX MICROSCOPIC
Glucose, UA: NEGATIVE mg/dL
Hgb urine dipstick: NEGATIVE
KETONES UR: 15 mg/dL — AB
NITRITE: POSITIVE — AB
Protein, ur: 30 mg/dL — AB
SPECIFIC GRAVITY, URINE: 1.034 — AB (ref 1.005–1.030)
UROBILINOGEN UA: 1 mg/dL (ref 0.0–1.0)
pH: 5.5 (ref 5.0–8.0)

## 2013-03-13 LAB — HEPATIC FUNCTION PANEL
ALBUMIN: 3.7 g/dL (ref 3.5–5.2)
ALT: 14 U/L (ref 0–35)
AST: 24 U/L (ref 0–37)
Alkaline Phosphatase: 70 U/L (ref 39–117)
BILIRUBIN TOTAL: 0.4 mg/dL (ref 0.3–1.2)
Total Protein: 8 g/dL (ref 6.0–8.3)

## 2013-03-13 LAB — BASIC METABOLIC PANEL
BUN: 17 mg/dL (ref 6–23)
CHLORIDE: 102 meq/L (ref 96–112)
CO2: 22 mEq/L (ref 19–32)
Calcium: 9.3 mg/dL (ref 8.4–10.5)
Creatinine, Ser: 0.98 mg/dL (ref 0.50–1.10)
GFR, EST AFRICAN AMERICAN: 66 mL/min — AB (ref >90)
GFR, EST NON AFRICAN AMERICAN: 57 mL/min — AB (ref >90)
Glucose, Bld: 121 mg/dL — ABNORMAL HIGH (ref 70–99)
Potassium: 5.3 mEq/L (ref 3.7–5.3)
SODIUM: 138 meq/L (ref 137–147)

## 2013-03-13 MED ORDER — MORPHINE SULFATE 4 MG/ML IJ SOLN
4.0000 mg | Freq: Once | INTRAMUSCULAR | Status: AC
  Administered 2013-03-13: 4 mg via INTRAVENOUS
  Filled 2013-03-13: qty 1

## 2013-03-13 MED ORDER — ONDANSETRON HCL 4 MG/2ML IJ SOLN
4.0000 mg | Freq: Once | INTRAMUSCULAR | Status: AC
  Administered 2013-03-13: 4 mg via INTRAVENOUS
  Filled 2013-03-13: qty 2

## 2013-03-13 MED ORDER — SODIUM CHLORIDE 0.9 % IV BOLUS (SEPSIS)
500.0000 mL | Freq: Once | INTRAVENOUS | Status: AC
  Administered 2013-03-13: 500 mL via INTRAVENOUS

## 2013-03-13 NOTE — ED Notes (Signed)
Pt finished with oral contrast for CT scan. CT notified.

## 2013-03-13 NOTE — ED Notes (Signed)
Pt presents to department for evaluation of fever, diarrhea, R sided rib cage pain, productive cough and generalized fatigue. Family states pt hasn't been feeling well x1 week. States she passed out and fell at home today. She is alert and oriented x4. Respirations unlabored. States 5/10 R sided rib cage pain.

## 2013-03-13 NOTE — ED Provider Notes (Signed)
CSN: [id number]     Arrival date & time 03/13/13  1829 History   First MD Initiated Contact with Patient 03/13/13 2054     Chief Complaint  Patient presents with  . Fall  . Loss of Consciousness  . Diarrhea  . Fever     (Consider location/radiation/quality/duration/timing/severity/associated sxs/prior Treatment) HPI Comments: Patient developed URI symptoms, with a cough approximately one week ago.  This, afternoon, she developed acute onset of nausea, vomiting, and diarrhea.  She said several episodes of diarrhea, today, while in the bathroom.  This evening.  She passed out.  She is unsure, if she hit her head, but she is cut right rib and right hip pain. She is noted to have a fever at him to 102.  Yesterday.  She is coughing green-yellow sputum she does not have a history of any respiratory diseases.  She has an appointment with her physician in March for routine physical exam.  She has not contacted her physician with this bout of illness.  Because "she doesn't like to have the doctor."  Patient is a 73 y.o. female presenting with fall, syncope, diarrhea, and fever. The history is provided by the patient.  Fall This is a new problem. The current episode started today. The problem has been rapidly improving. Associated symptoms include chest pain, congestion, coughing, a fever, nausea and vomiting. Pertinent negatives include no chills, headaches, joint swelling or neck pain. Nothing aggravates the symptoms. The treatment provided no relief.  Loss of Consciousness Associated symptoms: chest pain, fever, nausea and vomiting   Associated symptoms: no dizziness, no headaches and no shortness of breath   Diarrhea Associated symptoms: fever and vomiting   Associated symptoms: no chills and no headaches   Fever Associated symptoms: chest pain, congestion, cough, diarrhea, nausea, rhinorrhea and vomiting   Associated symptoms: no chills, no ear pain and no headaches     Past Medical History    Diagnosis Date  . Hypertension   . Diabetes mellitus   . Headache(784.0)     hx of migraines   . Arthritis     generalized   . Cancer     hx of cancer being removed from skin on abdomen    Past Surgical History  Procedure Laterality Date  . Abdominal hysterectomy    . Other surgical history      large cyst removed from right ovary   . Breast lumpectomy      right- benign   . Back surgery      x3- lower back   . Other surgical history      cervical neck surgery x 2   . Other surgical history      surgery to remove blood clot from neck   . Hemorrhoid surgery    . Other surgical history      cancer removed from abdominal skin   . Shoulder open rotator cuff repair  06/23/2011    Procedure: ROTATOR CUFF REPAIR SHOULDER OPEN;  Surgeon: Tobi Bastos, MD;  Location: [facility];  Service: Orthopedics;  Laterality: Right;  Right Shoulder Rotator Cuff Repair with Open Acrominectomy, Closed Manipulation and Graft  . Shoulder closed reduction  06/23/2011    Procedure: CLOSED MANIPULATION SHOULDER;  Surgeon: Tobi Bastos, MD;  Location: [facility];  Service: Orthopedics;  Laterality: Right;   No family history on file. History  Substance Use Topics  . Smoking status: Former Smoker -- 4.00 packs/day for 30 years    Quit date: 02/02/1988  .  Smokeless tobacco: Never Used  . Alcohol Use: Yes     Comment: occasional glass of wine    OB History   Grav Para Term Preterm Abortions TAB SAB Ect Mult Living                 Review of Systems  Constitutional: Positive for fever. Negative for chills.  HENT: Positive for congestion and rhinorrhea. Negative for ear pain.   Respiratory: Positive for cough. Negative for shortness of breath, wheezing and stridor.   Cardiovascular: Positive for chest pain and syncope. Negative for leg swelling.  Gastrointestinal: Positive for nausea, vomiting and diarrhea.  Genitourinary: Positive for decreased urine volume.  Musculoskeletal: Positive for gait  problem. Negative for back pain, joint swelling and neck pain.  Skin: Negative for wound.  Neurological: Negative for dizziness and headaches.  All other systems reviewed and are negative.      Allergies  Betasept surgical scrub  Home Medications   Current Outpatient Rx  Name  Route  Sig  Dispense  Refill  . amitriptyline (ELAVIL) 50 MG tablet   Oral   Take 50 mg by mouth at bedtime.         Marland Kitchen aspirin 81 MG tablet   Oral   Take 81 mg by mouth daily.         Marland Kitchen aspirin-sod bicarb-citric acid (ALKA-SELTZER) 325 MG TBEF tablet   Oral   Take 325 mg by mouth every 6 (six) hours as needed.         Marland Kitchen dextromethorphan-guaiFENesin (MUCINEX DM) 30-600 MG per 12 hr tablet   Oral   Take 1 tablet by mouth 2 (two) times daily.         . Glucosamine-Chondroit-Vit C-Mn (GLUCOSAMINE 1500 COMPLEX PO)   Oral   Take 1,500 mg by mouth daily.         . metFORMIN (GLUMETZA) 1000 MG (MOD) 24 hr tablet   Oral   Take 1,000 mg by mouth 2 (two) times daily.          . naproxen sodium (ANAPROX) 220 MG tablet   Oral   Take 220 mg by mouth 2 (two) times daily with a meal.         . Omega-3 Fatty Acids (FISH OIL) 1000 MG CAPS   Oral   Take 1,000 mg by mouth daily.         . propranolol ER (INDERAL LA) 80 MG 24 hr capsule   Oral   Take 80 mg by mouth at bedtime.          . Red Yeast Rice Extract (RED YEAST RICE PO)   Oral   Take 2 tablets by mouth at bedtime.         . ondansetron (ZOFRAN ODT) 4 MG disintegrating tablet   Oral   Take 1 tablet (4 mg total) by mouth every 8 (eight) hours as needed for nausea or vomiting.   20 tablet   0   . oxyCODONE-acetaminophen (PERCOCET/ROXICET) 5-325 MG per tablet   Oral   Take 1-2 tablets by mouth every 6 (six) hours as needed for severe pain.   20 tablet   0    BP 140/65  Pulse 79  Temp(Src) 97.5 F (36.4 C) (Oral)  Resp 14  Ht 5\' 6"  (1.676 m)  Wt 180 lb (81.647 kg)  BMI 29.07 kg/m2  SpO2 95% Physical Exam  Vitals  reviewed. Constitutional: She is oriented to person, place, and time. She appears  well-developed and well-nourished.  HENT:  Head: Normocephalic.  Mouth/Throat: Oropharynx is clear and moist.  Eyes: Pupils are equal, round, and reactive to light.  Neck: Normal range of motion.  Cardiovascular: Normal rate and regular rhythm.   Pulmonary/Chest: Effort normal and breath sounds normal. She has no wheezes. She has no rales. She exhibits tenderness.  Abdominal: Soft. She exhibits no distension. Bowel sounds are increased. There is no tenderness.  Musculoskeletal: She exhibits tenderness. She exhibits no edema.       Right hip: She exhibits decreased range of motion and tenderness. She exhibits normal strength, no swelling, no crepitus and no deformity.       Legs: Neurological: She is alert and oriented to person, place, and time.  Skin: Skin is warm. No rash noted. No erythema.    ED Course  Procedures (including critical care time) Labs Review Labs Reviewed  CBC WITH DIFFERENTIAL - Abnormal; Notable for the following:    Neutrophils Relative % 83 (*)    Lymphocytes Relative 11 (*)    All other components within normal limits  BASIC METABOLIC PANEL - Abnormal; Notable for the following:    Glucose, Bld 121 (*)    GFR calc non Af Amer 57 (*)    GFR calc Af Amer 66 (*)    All other components within normal limits  URINALYSIS, ROUTINE W REFLEX MICROSCOPIC - Abnormal; Notable for the following:    Color, Urine RED (*)    APPearance CLOUDY (*)    Specific Gravity, Urine 1.034 (*)    Bilirubin Urine LARGE (*)    Ketones, ur 15 (*)    Protein, ur 30 (*)    Nitrite POSITIVE (*)    Leukocytes, UA SMALL (*)    All other components within normal limits  URINE MICROSCOPIC-ADD ON - Abnormal; Notable for the following:    Squamous Epithelial / LPF FEW (*)    Bacteria, UA FEW (*)    Casts HYALINE CASTS (*)    All other components within normal limits  HEPATIC FUNCTION PANEL   Imaging  Review Dg Chest 2 View  03/13/2013   CLINICAL DATA:  Status post fall.  Right rib pain.  EXAM: CHEST  2 VIEW  COMPARISON:  CT chest 06/22/2011 and PA and lateral chest 06/17/2011.  FINDINGS: The lungs are clear with some mild scarring or atelectasis in the lingula again seen. Heart size is normal. No pneumothorax or pleural effusion. Thoracic spondylosis is noted.  IMPRESSION: No acute disease.   Electronically Signed   By: Inge Rise M.D.   On: 03/13/2013 19:36   Dg Hip Complete Right  03/13/2013   CLINICAL DATA:  Right hip pain.  Right hip trauma.  EXAM: RIGHT HIP - COMPLETE 2+ VIEW  COMPARISON:  None.  FINDINGS: The hips appear located bilaterally. Pelvic rings are intact. Mild right hip osteoarthritis is present. Lower lumbar spondylosis. SI joints and sacral arcades appear normal. Phleboliths. Femoral neck appears intact.  IMPRESSION: Mild right hip osteoarthritis without acute osseous injury.   Electronically Signed   By: Dereck Ligas M.D.   On: 03/13/2013 22:35   Ct Head Wo Contrast  03/13/2013   CLINICAL DATA:  Fever and diarrhea. Fall. Syncopal episode on the toilet with head trauma.  EXAM: CT HEAD WITHOUT CONTRAST  TECHNIQUE: Contiguous axial images were obtained from the base of the skull through the vertex without intravenous contrast.  COMPARISON:  MR C SPINE W/O CM dated 09/07/2005; MR HEAD WO/W CM dated  03/23/2003  FINDINGS: Scout images appear within normal limits. No mass lesion, mass effect, midline shift, hydrocephalus, hemorrhage. No territorial ischemia or acute infarction. There is no significant atrophy or chronic ischemic white matter disease which is unusual in this age group. Intracranial atherosclerosis is present. Mucosal thickening is present in the right maxillary sinus with right maxillary antrostomy.  IMPRESSION: 1. Negative CT brain. 2. Chronic paranasal sinus disease. Postoperative changes of the right maxillary sinus.   Electronically Signed   By: Dereck Ligas M.D.    On: 03/13/2013 22:59   Ct Abdomen Pelvis W Contrast  03/14/2013   CLINICAL DATA:  The area abdominal pain. Generalized fatigue for 1 week.  EXAM: CT ABDOMEN AND PELVIS WITH CONTRAST  TECHNIQUE: Multidetector CT imaging of the abdomen and pelvis was performed using the standard protocol following bolus administration of intravenous contrast.  CONTRAST:  145mL OMNIPAQUE IOHEXOL 300 MG/ML  SOLN  COMPARISON:  None available for comparison at time of study interpretation.  FINDINGS: Included view of the lung bases are clear. Visualized heart and pericardium are unremarkable.  The liver is diffusely hypodense most consistent with fatty infiltration and otherwise unremarkable. The gallbladder, pancreas and adrenal glands are unremarkable. Splenic granulomas, the spleen is otherwise unremarkable.  The stomach, small and large bowel are normal in course and caliber without inflammatory changes. Contrast has yet to reach the large bowel. Moderate amount of retained large bowel stool. A few sigmoid diverticula without superimposed inflammatory changes. Normal appendix. No intraperitoneal free fluid nor free air.  Kidneys are orthotopic, demonstrating symmetric enhancement without nephrolithiasis, hydronephrosis or renal masses. Accessory renal artery on the left extending to the lower pole. Too small to characterize hypodensity in left interpolar kidney, right interpolar kidney. The unopacified ureters are normal in course and caliber. Delayed imaging through the kidneys demonstrates symmetric prompt excretion to the proximal urinary collecting system. Urinary bladder is partially distended and unremarkable.  Included vessels are normal in course and caliber with moderate to severe calcific atherosclerosis. No lymphadenopathy by CT size criteria. Status post hysterectomy. The soft tissues and included osseous structures are nonsuspicious. 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  IMPRESSION: Moderate amount of retained large bowel stool without bowel obstruction. No acute intra-abdominal or pelvic process.  Fatty liver.   Electronically Signed   By: Elon Alas   On: 03/14/2013 00:45    EKG Interpretation    Date/Time:  Tuesday March 13 2013 18:54:29 EST Ventricular Rate:  78 PR Interval:  176 QRS Duration: 82 QT Interval:  386 QTC Calculation: 440 R Axis:   86 Text Interpretation:  Normal sinus rhythm Normal ECG No significant change since last tracing Confirmed by GOLDSTON  MD, SCOTT ([id number]) on 03/13/2013 11:30:43 PM            MDM   Final diagnoses:  Rib contusion  Fall  Vasovagal syncope  Nausea vomiting and diarrhea      patient had a head CT chest x-ray hip x-ray, and CT of the abdomen, all within normal range.  She's been ambulated in her room with a minor amount of pain in her hip.  Most of her pain is isolated to her right ribs at this time.  She will be discharged home with pain medication with the assumption of rib fractures.  Although there is new over to sign of her fracture.  At this time.  She does have a niece that stays with  her 24 7 and will be able to assist.  Her with ambulation.  Medications and followup with her primary care physician    Garald Balding, NP 03/14/13 0403

## 2013-03-14 ENCOUNTER — Emergency Department (HOSPITAL_COMMUNITY): Payer: Medicare Other

## 2013-03-14 DIAGNOSIS — K7689 Other specified diseases of liver: Secondary | ICD-10-CM | POA: Diagnosis not present

## 2013-03-14 MED ORDER — OXYCODONE-ACETAMINOPHEN 5-325 MG PO TABS
1.0000 | ORAL_TABLET | Freq: Once | ORAL | Status: AC
  Administered 2013-03-14: 1 via ORAL
  Filled 2013-03-14: qty 1

## 2013-03-14 MED ORDER — ONDANSETRON 4 MG PO TBDP: 4.0000 mg | ORAL_TABLET | Freq: Three times a day (TID) | ORAL | Status: DC | PRN

## 2013-03-14 MED ORDER — OXYCODONE-ACETAMINOPHEN 5-325 MG PO TABS: 1.0000 | ORAL_TABLET | Freq: Four times a day (QID) | ORAL | Status: DC | PRN

## 2013-03-14 MED ORDER — IOHEXOL 300 MG/ML  SOLN
100.0000 mL | Freq: Once | INTRAMUSCULAR | Status: AC | PRN
  Administered 2013-03-14: 100 mL via INTRAVENOUS

## 2013-03-14 MED ORDER — HYDROCOD POLST-CHLORPHEN POLST 10-8 MG/5ML PO LQCR: 5.0000 mL | Freq: Two times a day (BID) | ORAL | Status: DC | PRN

## 2013-03-14 MED ORDER — HYDROCOD POLST-CHLORPHEN POLST 10-8 MG/5ML PO LQCR
5.0000 mL | Freq: Once | ORAL | Status: AC
  Administered 2013-03-14: 5 mL via ORAL
  Filled 2013-03-14: qty 5

## 2013-03-14 NOTE — ED Notes (Signed)
Pt ambulated to restroom with slow, steady gait with stand by assist.

## 2013-03-14 NOTE — ED Notes (Signed)
Baker Janus, NP in to see patient.

## 2013-03-14 NOTE — Discharge Instructions (Signed)
Please use the Zofran, and Percocet as needed.  For pain.  Please make an appointment with your primary care physician to be followed up in the next 2-3, days if you develop any new or worsening symptoms.  Please return for further evaluation Tonight, your x-ray of your chest did not reveal any displaced rib fractures.  Your hip x-ray is normal, your head CT is normal, your abdominal CT isn't normal

## 2013-03-14 NOTE — ED Provider Notes (Signed)
Medical screening examination/treatment/procedure(s) were performed by non-physician practitioner and as supervising physician I was immediately available for consultation/collaboration.  EKG Interpretation    Date/Time:  Tuesday March 13 2013 18:54:29 EST Ventricular Rate:  78 PR Interval:  176 QRS Duration: 82 QT Interval:  386 QTC Calculation: 440 R Axis:   86 Text Interpretation:  Normal sinus rhythm Normal ECG No significant change since last tracing Confirmed by Barbarita Hutmacher  MD, Nam Vossler ([id number]) on 03/13/2013 11:30:43 PM              Ephraim Hamburger, MD 03/14/13 1042

## 2013-05-16 DIAGNOSIS — E7800 Pure hypercholesterolemia, unspecified: Secondary | ICD-10-CM | POA: Diagnosis not present

## 2013-05-16 DIAGNOSIS — E119 Type 2 diabetes mellitus without complications: Secondary | ICD-10-CM | POA: Diagnosis not present

## 2013-05-16 DIAGNOSIS — M545 Low back pain, unspecified: Secondary | ICD-10-CM | POA: Diagnosis not present

## 2013-05-16 DIAGNOSIS — I10 Essential (primary) hypertension: Secondary | ICD-10-CM | POA: Diagnosis not present

## 2013-05-16 DIAGNOSIS — M25559 Pain in unspecified hip: Secondary | ICD-10-CM | POA: Diagnosis not present

## 2013-06-06 DIAGNOSIS — M76899 Other specified enthesopathies of unspecified lower limb, excluding foot: Secondary | ICD-10-CM | POA: Diagnosis not present

## 2013-10-01 DIAGNOSIS — M76899 Other specified enthesopathies of unspecified lower limb, excluding foot: Secondary | ICD-10-CM | POA: Diagnosis not present

## 2013-11-15 DIAGNOSIS — Z23 Encounter for immunization: Secondary | ICD-10-CM | POA: Diagnosis not present

## 2013-11-15 DIAGNOSIS — M810 Age-related osteoporosis without current pathological fracture: Secondary | ICD-10-CM | POA: Diagnosis not present

## 2013-11-15 DIAGNOSIS — M545 Low back pain: Secondary | ICD-10-CM | POA: Diagnosis not present

## 2013-11-15 DIAGNOSIS — Z0000 Encounter for general adult medical examination without abnormal findings: Secondary | ICD-10-CM | POA: Diagnosis not present

## 2013-11-15 DIAGNOSIS — E119 Type 2 diabetes mellitus without complications: Secondary | ICD-10-CM | POA: Diagnosis not present

## 2013-11-15 DIAGNOSIS — Z1211 Encounter for screening for malignant neoplasm of colon: Secondary | ICD-10-CM | POA: Diagnosis not present

## 2013-11-15 DIAGNOSIS — I10 Essential (primary) hypertension: Secondary | ICD-10-CM | POA: Diagnosis not present

## 2013-11-15 DIAGNOSIS — E780 Pure hypercholesterolemia: Secondary | ICD-10-CM | POA: Diagnosis not present

## 2014-01-02 ENCOUNTER — Other Ambulatory Visit: Payer: Self-pay

## 2014-01-02 DIAGNOSIS — Z1231 Encounter for screening mammogram for malignant neoplasm of breast: Secondary | ICD-10-CM

## 2014-01-08 DIAGNOSIS — K5730 Diverticulosis of large intestine without perforation or abscess without bleeding: Secondary | ICD-10-CM | POA: Diagnosis not present

## 2014-01-08 DIAGNOSIS — D125 Benign neoplasm of sigmoid colon: Secondary | ICD-10-CM | POA: Diagnosis not present

## 2014-01-08 DIAGNOSIS — D126 Benign neoplasm of colon, unspecified: Secondary | ICD-10-CM | POA: Diagnosis not present

## 2014-01-08 DIAGNOSIS — Z1211 Encounter for screening for malignant neoplasm of colon: Secondary | ICD-10-CM | POA: Diagnosis not present

## 2014-01-08 DIAGNOSIS — D124 Benign neoplasm of descending colon: Secondary | ICD-10-CM | POA: Diagnosis not present

## 2014-01-22 ENCOUNTER — Ambulatory Visit: Payer: Medicare Other

## 2014-01-31 ENCOUNTER — Other Ambulatory Visit: Payer: Self-pay

## 2014-01-31 ENCOUNTER — Ambulatory Visit
Admission: RE | Admit: 2014-01-31 | Discharge: 2014-01-31 | Disposition: A | Discharge status: Home / Self Care | Payer: Medicare Other | Source: Ambulatory Visit

## 2014-01-31 DIAGNOSIS — Z1231 Encounter for screening mammogram for malignant neoplasm of breast: Secondary | ICD-10-CM

## 2014-02-15 DIAGNOSIS — M542 Cervicalgia: Secondary | ICD-10-CM | POA: Diagnosis not present

## 2014-02-15 DIAGNOSIS — L0293 Carbuncle, unspecified: Secondary | ICD-10-CM | POA: Diagnosis not present

## 2014-05-20 DIAGNOSIS — E119 Type 2 diabetes mellitus without complications: Secondary | ICD-10-CM | POA: Diagnosis not present

## 2014-05-20 DIAGNOSIS — E780 Pure hypercholesterolemia: Secondary | ICD-10-CM | POA: Diagnosis not present

## 2014-05-20 DIAGNOSIS — I10 Essential (primary) hypertension: Secondary | ICD-10-CM | POA: Diagnosis not present

## 2014-05-20 DIAGNOSIS — M545 Low back pain: Secondary | ICD-10-CM | POA: Diagnosis not present

## 2014-05-20 DIAGNOSIS — M810 Age-related osteoporosis without current pathological fracture: Secondary | ICD-10-CM | POA: Diagnosis not present

## 2014-08-05 DIAGNOSIS — T7840XA Allergy, unspecified, initial encounter: Secondary | ICD-10-CM | POA: Diagnosis not present

## 2014-08-05 DIAGNOSIS — R600 Localized edema: Secondary | ICD-10-CM | POA: Diagnosis not present

## 2014-08-06 DIAGNOSIS — L240 Irritant contact dermatitis due to detergents: Secondary | ICD-10-CM | POA: Diagnosis not present

## 2014-11-19 DIAGNOSIS — E7800 Pure hypercholesterolemia, unspecified: Secondary | ICD-10-CM | POA: Diagnosis not present

## 2014-11-19 DIAGNOSIS — E119 Type 2 diabetes mellitus without complications: Secondary | ICD-10-CM | POA: Diagnosis not present

## 2014-11-19 DIAGNOSIS — I10 Essential (primary) hypertension: Secondary | ICD-10-CM | POA: Diagnosis not present

## 2014-11-19 DIAGNOSIS — Z23 Encounter for immunization: Secondary | ICD-10-CM | POA: Diagnosis not present

## 2014-11-19 DIAGNOSIS — M545 Low back pain: Secondary | ICD-10-CM | POA: Diagnosis not present

## 2014-11-19 DIAGNOSIS — M810 Age-related osteoporosis without current pathological fracture: Secondary | ICD-10-CM | POA: Diagnosis not present

## 2014-11-19 DIAGNOSIS — D489 Neoplasm of uncertain behavior, unspecified: Secondary | ICD-10-CM | POA: Diagnosis not present

## 2014-11-19 DIAGNOSIS — H578 Other specified disorders of eye and adnexa: Secondary | ICD-10-CM | POA: Diagnosis not present

## 2014-11-25 DIAGNOSIS — L820 Inflamed seborrheic keratosis: Secondary | ICD-10-CM | POA: Diagnosis not present

## 2014-11-25 DIAGNOSIS — L570 Actinic keratosis: Secondary | ICD-10-CM | POA: Diagnosis not present

## 2014-12-27 DIAGNOSIS — M545 Low back pain: Secondary | ICD-10-CM | POA: Diagnosis not present

## 2015-07-28 DIAGNOSIS — H2511 Age-related nuclear cataract, right eye: Secondary | ICD-10-CM | POA: Diagnosis not present

## 2017-07-04 DIAGNOSIS — M1631 Unilateral osteoarthritis resulting from hip dysplasia, right hip: Secondary | ICD-10-CM | POA: Insufficient documentation

## 2017-07-04 DIAGNOSIS — M7061 Trochanteric bursitis, right hip: Secondary | ICD-10-CM | POA: Insufficient documentation

## 2017-11-14 ENCOUNTER — Ambulatory Visit: Payer: Self-pay | Admitting: Orthopedic Surgery

## 2017-11-14 NOTE — H&P (Signed)
TOTAL HIP ADMISSION H&P  Patient is admitted for right total hip arthroplasty.  Subjective:  Chief Complaint: right hip pain  HPI: Sierra [name], 77 y.o. female, has a history of pain and functional disability in the right hip(s) due to arthritis and patient has failed non-surgical conservative treatments for greater than 12 weeks to include NSAID's and/or analgesics, flexibility and strengthening excercises, use of assistive devices, weight reduction as appropriate and activity modification.  Onset of symptoms was gradual starting >10 years ago with gradually worsening course since that time.The patient noted no past surgery on the right hip(s).  Patient currently rates pain in the bilaterally hip at 10 out of 10 with activity. Patient has night pain, worsening of pain with activity and weight bearing, trendelenberg gait, pain that interfers with activities of daily living and pain with passive range of motion. Patient has evidence of subchondral cysts, subchondral sclerosis, periarticular osteophytes and joint space narrowing by imaging studies. This condition presents safety issues increasing the risk of falls. There is no current active infection.  There are no active problems to display for this patient.  Past Medical History:  Diagnosis Date  . Arthritis    generalized   . Bilateral cataracts   . Cancer ([place])    hx of cancer being removed from skin on abdomen   . DDD (degenerative disc disease), cervical   . Diabetes mellitus   . Fatty liver   . Headache(784.0)    hx of migraines   . Hearing loss    Right ear  . Hypertension     Past Surgical History:  Procedure Laterality Date  . ABDOMINAL HYSTERECTOMY    . BACK SURGERY     x3- lower back   . BREAST LUMPECTOMY     right- benign   . CATARACT EXTRACTION, BILATERAL    . COLONOSCOPY     x2  . HEMORRHOID SURGERY    . OTHER SURGICAL HISTORY     large cyst removed from right ovary   . OTHER SURGICAL HISTORY     cervical neck  surgery x 2   . OTHER SURGICAL HISTORY     surgery to remove blood clot from neck   . OTHER SURGICAL HISTORY     cancer removed from abdominal skin   . SHOULDER CLOSED REDUCTION  06/23/2011   Procedure: CLOSED MANIPULATION SHOULDER;  Surgeon: Tobi Bastos, MD;  Location: [facility];  Service: Orthopedics;  Laterality: Right;  . SHOULDER OPEN ROTATOR CUFF REPAIR  06/23/2011   Procedure: ROTATOR CUFF REPAIR SHOULDER OPEN;  Surgeon: Tobi Bastos, MD;  Location: [facility];  Service: Orthopedics;  Laterality: Right;  Right Shoulder Rotator Cuff Repair with Open Acrominectomy, Closed Manipulation and Graft    Current Outpatient Medications  Medication Sig Dispense Refill Last Dose  . alendronate (FOSAMAX) 70 MG tablet Take 70 mg by mouth every Tuesday. Take with a full glass of water on an empty stomach.     Marland Kitchen amitriptyline (ELAVIL) 50 MG tablet Take 50 mg by mouth at bedtime.   03/12/2013 at Unknown time  . chlorpheniramine-HYDROcodone (TUSSIONEX) 10-8 MG/5ML LQCR Take 5 mLs by mouth every 12 (twelve) hours as needed for cough. (Patient not taking: Reported on 11/14/2017) 100 mL 0 Not Taking at Unknown time  . metFORMIN (GLUMETZA) 1000 MG (MOD) 24 hr tablet Take 1,000 mg by mouth 2 (two) times daily.    03/12/2013 at Unknown time  . Omega-3 Fatty Acids (FISH OIL) 1000 MG CAPS Take  1,000 mg by mouth daily.   03/12/2013 at Unknown time  . oxyCODONE-acetaminophen (PERCOCET/ROXICET) 5-325 MG per tablet Take 1-2 tablets by mouth every 6 (six) hours as needed for severe pain. (Patient not taking: Reported on 11/14/2017) 20 tablet 0 Not Taking at Unknown time  . propranolol ER (INDERAL LA) 80 MG 24 hr capsule Take 80 mg by mouth daily.    03/12/2013 at Unknown time  . rosuvastatin (CRESTOR) 10 MG tablet Take 10 mg by mouth 2 (two) times a week.  1   . tiZANidine (ZANAFLEX) 2 MG tablet Take 2 mg by mouth at bedtime.     . traMADol (ULTRAM) 50 MG tablet Take 50 mg by mouth every 6 (six) hours as needed for severe  pain.     . vitamin B-12 (CYANOCOBALAMIN) 1000 MCG tablet Take 1,000 mcg by mouth daily.      No current facility-administered medications for this visit.    Allergies  Allergen Reactions  . Betasept Surgical Scrub [Chlorhexidine] Itching    Social History   Tobacco Use  . Smoking status: Former Smoker    Packs/day: 4.00    Years: 30.00    Pack years: 120.00    Last attempt to quit: 02/02/1988    Years since quitting: 29.8  . Smokeless tobacco: Never Used  Substance Use Topics  . Alcohol use: Yes    Comment: occasional glass of wine     No family history on file.   Review of Systems  Constitutional: Negative.   HENT: Negative.   Eyes: Negative.   Respiratory: Negative.   Cardiovascular: Negative.   Gastrointestinal: Negative.   Genitourinary: Negative.   Musculoskeletal: Positive for back pain and joint pain.  Skin: Negative.   Neurological: Negative.   Endo/Heme/Allergies: Negative.   Psychiatric/Behavioral: Negative.     Objective:  Physical Exam  Vitals reviewed. Constitutional: She is oriented to person, place, and time. She appears well-developed and well-nourished.  HENT:  Head: Normocephalic and atraumatic.  Eyes: Pupils are equal, round, and reactive to light. Conjunctivae and EOM are normal.  Neck: Normal range of motion. Neck supple.  Cardiovascular: Normal rate, regular rhythm and intact distal pulses.  Respiratory: Effort normal. No respiratory distress.  GI: Soft. She exhibits no distension.  Genitourinary:  Genitourinary Comments: deferred  Musculoskeletal:       Right hip: She exhibits decreased range of motion, decreased strength and tenderness.  Neurological: She is alert and oriented to person, place, and time. She has normal reflexes.  Skin: Skin is warm and dry.  Psychiatric: She has a normal mood and affect. Her behavior is normal. Judgment and thought content normal.    Vital signs in last 24 hours: @VSRANGES @  Labs:   Estimated  body mass index is 32.65 kg/m as calculated from the following:   Height as of 11/21/17: 5\' 7"  (1.702 m).   Weight as of 11/21/17: 94.5 kg.   Imaging Review Plain radiographs demonstrate severe degenerative joint disease of the right hip(s). The bone quality appears to be adequate for age and reported activity level.    Preoperative templating of the joint replacement has been completed, documented, and submitted to the Operating Room personnel in order to optimize intra-operative equipment management.     Assessment/Plan:  End stage arthritis, right hip(s)  The patient history, physical examination, clinical judgement of the provider and imaging studies are consistent with end stage degenerative joint disease of the right hip(s) and total hip arthroplasty is deemed medically necessary. The  treatment options including medical management, injection therapy, arthroscopy and arthroplasty were discussed at length. The risks and benefits of total hip arthroplasty were presented and reviewed. The risks due to aseptic loosening, infection, stiffness, dislocation/subluxation,  thromboembolic complications and other imponderables were discussed.  The patient acknowledged the explanation, agreed to proceed with the plan and consent was signed. Patient is being admitted for inpatient treatment for surgery, pain control, PT, OT, prophylactic antibiotics, VTE prophylaxis, progressive ambulation and ADL's and discharge planning.The patient is planning to be discharged home with HEP. Rx for walker given.

## 2017-11-14 NOTE — H&P (View-Only) (Signed)
TOTAL HIP ADMISSION H&P  Patient is admitted for right total hip arthroplasty.  Subjective:  Chief Complaint: right hip pain  HPI: Sierra [name], 77 y.o. female, has a history of pain and functional disability in the right hip(s) due to arthritis and patient has failed non-surgical conservative treatments for greater than 12 weeks to include NSAID's and/or analgesics, flexibility and strengthening excercises, use of assistive devices, weight reduction as appropriate and activity modification.  Onset of symptoms was gradual starting >10 years ago with gradually worsening course since that time.The patient noted no past surgery on the right hip(s).  Patient currently rates pain in the bilaterally hip at 10 out of 10 with activity. Patient has night pain, worsening of pain with activity and weight bearing, trendelenberg gait, pain that interfers with activities of daily living and pain with passive range of motion. Patient has evidence of subchondral cysts, subchondral sclerosis, periarticular osteophytes and joint space narrowing by imaging studies. This condition presents safety issues increasing the risk of falls. There is no current active infection.  There are no active problems to display for this patient.  Past Medical History:  Diagnosis Date  . Arthritis    generalized   . Bilateral cataracts   . Cancer ([place])    hx of cancer being removed from skin on abdomen   . DDD (degenerative disc disease), cervical   . Diabetes mellitus   . Fatty liver   . Headache(784.0)    hx of migraines   . Hearing loss    Right ear  . Hypertension     Past Surgical History:  Procedure Laterality Date  . ABDOMINAL HYSTERECTOMY    . BACK SURGERY     x3- lower back   . BREAST LUMPECTOMY     right- benign   . CATARACT EXTRACTION, BILATERAL    . COLONOSCOPY     x2  . HEMORRHOID SURGERY    . OTHER SURGICAL HISTORY     large cyst removed from right ovary   . OTHER SURGICAL HISTORY     cervical neck  surgery x 2   . OTHER SURGICAL HISTORY     surgery to remove blood clot from neck   . OTHER SURGICAL HISTORY     cancer removed from abdominal skin   . SHOULDER CLOSED REDUCTION  06/23/2011   Procedure: CLOSED MANIPULATION SHOULDER;  Surgeon: Tobi Bastos, MD;  Location: [facility];  Service: Orthopedics;  Laterality: Right;  . SHOULDER OPEN ROTATOR CUFF REPAIR  06/23/2011   Procedure: ROTATOR CUFF REPAIR SHOULDER OPEN;  Surgeon: Tobi Bastos, MD;  Location: [facility];  Service: Orthopedics;  Laterality: Right;  Right Shoulder Rotator Cuff Repair with Open Acrominectomy, Closed Manipulation and Graft    Current Outpatient Medications  Medication Sig Dispense Refill Last Dose  . alendronate (FOSAMAX) 70 MG tablet Take 70 mg by mouth every Tuesday. Take with a full glass of water on an empty stomach.     Marland Kitchen amitriptyline (ELAVIL) 50 MG tablet Take 50 mg by mouth at bedtime.   03/12/2013 at Unknown time  . chlorpheniramine-HYDROcodone (TUSSIONEX) 10-8 MG/5ML LQCR Take 5 mLs by mouth every 12 (twelve) hours as needed for cough. (Patient not taking: Reported on 11/14/2017) 100 mL 0 Not Taking at Unknown time  . metFORMIN (GLUMETZA) 1000 MG (MOD) 24 hr tablet Take 1,000 mg by mouth 2 (two) times daily.    03/12/2013 at Unknown time  . Omega-3 Fatty Acids (FISH OIL) 1000 MG CAPS Take  1,000 mg by mouth daily.   03/12/2013 at Unknown time  . oxyCODONE-acetaminophen (PERCOCET/ROXICET) 5-325 MG per tablet Take 1-2 tablets by mouth every 6 (six) hours as needed for severe pain. (Patient not taking: Reported on 11/14/2017) 20 tablet 0 Not Taking at Unknown time  . propranolol ER (INDERAL LA) 80 MG 24 hr capsule Take 80 mg by mouth daily.    03/12/2013 at Unknown time  . rosuvastatin (CRESTOR) 10 MG tablet Take 10 mg by mouth 2 (two) times a week.  1   . tiZANidine (ZANAFLEX) 2 MG tablet Take 2 mg by mouth at bedtime.     . traMADol (ULTRAM) 50 MG tablet Take 50 mg by mouth every 6 (six) hours as needed for severe  pain.     . vitamin B-12 (CYANOCOBALAMIN) 1000 MCG tablet Take 1,000 mcg by mouth daily.      No current facility-administered medications for this visit.    Allergies  Allergen Reactions  . Betasept Surgical Scrub [Chlorhexidine] Itching    Social History   Tobacco Use  . Smoking status: Former Smoker    Packs/day: 4.00    Years: 30.00    Pack years: 120.00    Last attempt to quit: 02/02/1988    Years since quitting: 29.8  . Smokeless tobacco: Never Used  Substance Use Topics  . Alcohol use: Yes    Comment: occasional glass of wine     No family history on file.   Review of Systems  Constitutional: Negative.   HENT: Negative.   Eyes: Negative.   Respiratory: Negative.   Cardiovascular: Negative.   Gastrointestinal: Negative.   Genitourinary: Negative.   Musculoskeletal: Positive for back pain and joint pain.  Skin: Negative.   Neurological: Negative.   Endo/Heme/Allergies: Negative.   Psychiatric/Behavioral: Negative.     Objective:  Physical Exam  Vitals reviewed. Constitutional: She is oriented to person, place, and time. She appears well-developed and well-nourished.  HENT:  Head: Normocephalic and atraumatic.  Eyes: Pupils are equal, round, and reactive to light. Conjunctivae and EOM are normal.  Neck: Normal range of motion. Neck supple.  Cardiovascular: Normal rate, regular rhythm and intact distal pulses.  Respiratory: Effort normal. No respiratory distress.  GI: Soft. She exhibits no distension.  Genitourinary:  Genitourinary Comments: deferred  Musculoskeletal:       Right hip: She exhibits decreased range of motion, decreased strength and tenderness.  Neurological: She is alert and oriented to person, place, and time. She has normal reflexes.  Skin: Skin is warm and dry.  Psychiatric: She has a normal mood and affect. Her behavior is normal. Judgment and thought content normal.    Vital signs in last 24 hours: @VSRANGES @  Labs:   Estimated  body mass index is 32.65 kg/m as calculated from the following:   Height as of 11/21/17: 5\' 7"  (1.702 m).   Weight as of 11/21/17: 94.5 kg.   Imaging Review Plain radiographs demonstrate severe degenerative joint disease of the right hip(s). The bone quality appears to be adequate for age and reported activity level.    Preoperative templating of the joint replacement has been completed, documented, and submitted to the Operating Room personnel in order to optimize intra-operative equipment management.     Assessment/Plan:  End stage arthritis, right hip(s)  The patient history, physical examination, clinical judgement of the provider and imaging studies are consistent with end stage degenerative joint disease of the right hip(s) and total hip arthroplasty is deemed medically necessary. The  treatment options including medical management, injection therapy, arthroscopy and arthroplasty were discussed at length. The risks and benefits of total hip arthroplasty were presented and reviewed. The risks due to aseptic loosening, infection, stiffness, dislocation/subluxation,  thromboembolic complications and other imponderables were discussed.  The patient acknowledged the explanation, agreed to proceed with the plan and consent was signed. Patient is being admitted for inpatient treatment for surgery, pain control, PT, OT, prophylactic antibiotics, VTE prophylaxis, progressive ambulation and ADL's and discharge planning.The patient is planning to be discharged home with HEP. Rx for walker given.

## 2017-11-16 ENCOUNTER — Ambulatory Visit: Payer: Self-pay | Admitting: Orthopedic Surgery

## 2017-11-21 ENCOUNTER — Encounter (HOSPITAL_COMMUNITY): Payer: Self-pay

## 2017-11-21 ENCOUNTER — Other Ambulatory Visit: Payer: Self-pay

## 2017-11-21 ENCOUNTER — Encounter (HOSPITAL_COMMUNITY)
Admission: RE | Admit: 2017-11-21 | Discharge: 2017-11-21 | Disposition: A | Discharge status: Home / Self Care | Payer: Medicare Other | Source: Ambulatory Visit | Attending: Orthopedic Surgery | Admitting: Orthopedic Surgery

## 2017-11-21 DIAGNOSIS — Z7982 Long term (current) use of aspirin: Secondary | ICD-10-CM | POA: Insufficient documentation

## 2017-11-21 DIAGNOSIS — Z79891 Long term (current) use of opiate analgesic: Secondary | ICD-10-CM | POA: Insufficient documentation

## 2017-11-21 DIAGNOSIS — M1611 Unilateral primary osteoarthritis, right hip: Secondary | ICD-10-CM | POA: Insufficient documentation

## 2017-11-21 DIAGNOSIS — Z7984 Long term (current) use of oral hypoglycemic drugs: Secondary | ICD-10-CM | POA: Insufficient documentation

## 2017-11-21 DIAGNOSIS — Z01818 Encounter for other preprocedural examination: Secondary | ICD-10-CM | POA: Insufficient documentation

## 2017-11-21 DIAGNOSIS — Z79899 Other long term (current) drug therapy: Secondary | ICD-10-CM

## 2017-11-21 HISTORY — DX: Unspecified cataract: H26.9

## 2017-11-21 HISTORY — DX: Other cervical disc degeneration, unspecified cervical region: M50.30

## 2017-11-21 HISTORY — DX: Unspecified hearing loss, unspecified ear: H91.90

## 2017-11-21 HISTORY — DX: Fatty (change of) liver, not elsewhere classified: K76.0

## 2017-11-21 LAB — CBC
HCT: 41.6 % (ref 36.0–46.0)
HEMOGLOBIN: 13 g/dL (ref 12.0–15.0)
MCH: 29.6 pg (ref 26.0–34.0)
MCHC: 31.3 g/dL (ref 30.0–36.0)
MCV: 94.8 fL (ref 80.0–100.0)
Platelets: 240 10*3/uL (ref 150–400)
RBC: 4.39 MIL/uL (ref 3.87–5.11)
RDW: 13.2 % (ref 11.5–15.5)
WBC: 7.2 10*3/uL (ref 4.0–10.5)
nRBC: 0 % (ref 0.0–0.2)

## 2017-11-21 LAB — GLUCOSE, CAPILLARY: GLUCOSE-CAPILLARY: 80 mg/dL (ref 70–99)

## 2017-11-21 LAB — SURGICAL PCR SCREEN
MRSA, PCR: NEGATIVE
STAPHYLOCOCCUS AUREUS: NEGATIVE

## 2017-11-21 NOTE — Patient Instructions (Addendum)
Your procedure is scheduled on: Thursday, Oct. 24, 2019   Surgery Time:  1:30PM-4:04PM   Report to [place]  Entrance    Report to admitting at 11:00 AM   Call this number if you have problems the morning of surgery [phone]   TAKE A SHOWER WITH DIAL [place]   Do not eat food:After Midnight.   May have liquids until 7:30AM morning of surgery  CLEAR LIQUID DIET  Foods Allowed                                                                     Foods Excluded  Water, Coffee and tea, regular and decaf                             liquids that you cannot  Plain Jell-O in any flavor                                             see through such as: Fruit ices (not with fruit pulp)                                     milk, soups, orange juice  Iced Popsicles                                    All solid food Carbonated beverages, regular and diet                                    Cranberry, grape and apple juices Sports drinks like Gatorade Lightly seasoned clear broth or consume(fat free) Sugar, honey syrup  Sample Menu Breakfast                                Lunch                                     Supper Cranberry juice                    Beef broth                            Chicken broth Jell-O                                     Grape juice                           Apple juice Coffee or tea  Jell-O                                      Popsicle                                                Coffee or tea                        Coffee or tea   Brush your teeth the morning of surgery.   Do NOT smoke after Midnight   Take these medicines the morning of surgery with A SIP OF WATER: PROPRANOLOL  DO NOT TAKE ANY DIABETIC MEDICATIONS DAY OF YOUR SURGERY                               You may not have any metal on your body including hair pins, jewelry, and body piercings              Do not wear make-up, lotions, powders, perfumes/cologne, or deodorant             Do not wear nail polish.  Do not shave  48 hours prior to surgery.                 Do not bring valuables to the hospital. [place].   Contacts, dentures or bridgework may not be worn into surgery.   Leave suitcase in the car. After surgery it may be brought to your room.   Special Instructions: Bring a copy of your healthcare power of attorney and living will documents         the day of surgery if you haven't scanned them in before.              Please read over the following fact sheets you were given:   Incentive Spirometer  An incentive spirometer is a tool that can help keep your lungs clear and active. This tool measures how well you are filling your lungs with each breath. Taking long deep breaths may help reverse or decrease the chance of developing breathing (pulmonary) problems (especially infection) following:  A long period of time when you are unable to move or be active. BEFORE THE PROCEDURE   If the spirometer includes an indicator to show your best effort, your nurse or respiratory therapist will set it to a desired goal.  If possible, sit up straight or lean slightly forward. Try not to slouch.  Hold the incentive spirometer in an upright position. INSTRUCTIONS FOR USE  1. Sit on the edge of your bed if possible, or sit up as far as you can in bed or on a chair. 2. Hold the incentive spirometer in an upright position. 3. Breathe out normally. 4. Place the mouthpiece in your mouth and seal your lips tightly around it. 5. Breathe in slowly and as deeply as possible, raising the piston or the ball toward the top of the column. 6. Hold your breath for 3-5 seconds or for as long as possible. Allow the piston or ball to fall to the bottom  of the column. 7. Remove the mouthpiece from your mouth and breathe out normally. 8. Rest for a few  seconds and repeat Steps 1 through 7 at least 10 times every 1-2 hours when you are awake. Take your time and take a few normal breaths between deep breaths. 9. The spirometer may include an indicator to show your best effort. Use the indicator as a goal to work toward during each repetition. 10. After each set of 10 deep breaths, practice coughing to be sure your lungs are clear. If you have an incision (the cut made at the time of surgery), support your incision when coughing by placing a pillow or rolled up towels firmly against it. Once you are able to get out of bed, walk around indoors and cough well. You may stop using the incentive spirometer when instructed by your caregiver.  RISKS AND COMPLICATIONS  Take your time so you do not get dizzy or light-headed.  If you are in pain, you may need to take or ask for pain medication before doing incentive spirometry. It is harder to take a deep breath if you are having pain. AFTER USE  Rest and breathe slowly and easily.  It can be helpful to keep track of a log of your progress. Your caregiver can provide you with a simple table to help with this. If you are using the spirometer at home, follow these instructions: [place] IF:   You are having difficultly using the spirometer.  You have trouble using the spirometer as often as instructed.  Your pain medication is not giving enough relief while using the spirometer.  You develop fever of 100.5 F (38.1 C) or higher. SEEK IMMEDIATE MEDICAL CARE IF:   You cough up bloody sputum that had not been present before.  You develop fever of 102 F (38.9 C) or greater.  You develop worsening pain at or near the incision site. MAKE SURE YOU:   Understand these instructions.  Will watch your condition.  Will get help right away if you are not doing well or get worse. Document Released: 05/31/2006 Document Revised: 04/12/2011 Document Reviewed: 08/01/2006 ExitCare Patient  Information 2014 ExitCare, [place].   ________________________________________________________________________  WHAT IS A BLOOD TRANSFUSION? Blood Transfusion Information  A transfusion is the replacement of blood or some of its parts. Blood is made up of multiple cells which provide different functions.  Red blood cells carry oxygen and are used for blood loss replacement.  White blood cells fight against infection.  Platelets control bleeding.  Plasma helps clot blood.  Other blood products are available for specialized needs, such as hemophilia or other clotting disorders. BEFORE THE TRANSFUSION  Who gives blood for transfusions?   Healthy volunteers who are fully evaluated to make sure their blood is safe. This is blood bank blood. Transfusion therapy is the safest it has ever been in the practice of medicine. Before blood is taken from a donor, a complete history is taken to make sure that person has no history of diseases nor engages in risky social behavior (examples are intravenous drug use or sexual activity with multiple partners). The donor's travel history is screened to minimize risk of transmitting infections, such as malaria. The donated blood is tested for signs of infectious diseases, such as HIV and hepatitis. The blood is then tested to be sure it is compatible with you in order to minimize the chance of a transfusion reaction. If you or a relative donates blood, this is  often done in anticipation of surgery and is not appropriate for emergency situations. It takes many days to process the donated blood. RISKS AND COMPLICATIONS Although transfusion therapy is very safe and saves many lives, the main dangers of transfusion include:   Getting an infectious disease.  Developing a transfusion reaction. This is an allergic reaction to something in the blood you were given. Every precaution is taken to prevent this. The decision to have a blood transfusion has been considered  carefully by your caregiver before blood is given. Blood is not given unless the benefits outweigh the risks. AFTER THE TRANSFUSION  Right after receiving a blood transfusion, you will usually feel much better and more energetic. This is especially true if your red blood cells have gotten low (anemic). The transfusion raises the level of the red blood cells which carry oxygen, and this usually causes an energy increase.  The nurse administering the transfusion will monitor you carefully for complications. HOME CARE INSTRUCTIONS  No special instructions are needed after a transfusion. You may find your energy is better. Speak with your caregiver about any limitations on activity for underlying diseases you may have. SEEK MEDICAL CARE IF:   Your condition is not improving after your transfusion.  You develop redness or irritation at the intravenous (IV) site. SEEK IMMEDIATE MEDICAL CARE IF:  Any of the following symptoms occur over the next 12 hours:  Shaking chills.  You have a temperature by mouth above 102 F (38.9 C), not controlled by medicine.  Chest, back, or muscle pain.  People around you feel you are not acting correctly or are confused.  Shortness of breath or difficulty breathing.  Dizziness and fainting.  You get a rash or develop hives.  You have a decrease in urine output.  Your urine turns a dark color or changes to pink, red, or brown. Any of the following symptoms occur over the next 10 days:  You have a temperature by mouth above 102 F (38.9 C), not controlled by medicine.  Shortness of breath.  Weakness after normal activity.  The white part of the eye turns yellow (jaundice).  You have a decrease in the amount of urine or are urinating less often.  Your urine turns a dark color or changes to pink, red, or brown. Document Released: 01/16/2000 Document Revised: 04/12/2011 Document Reviewed: 09/04/2007 [facility] Patient Information 2014 [place],  [place].  _______________________________________________________________________

## 2017-11-21 NOTE — Pre-Procedure Instructions (Signed)
The following are in the patient's hard chart: Medical clearance 10/07/2017 Dr. Alroy Dust Hgb A1C (7.6), CMP 10/26/2017

## 2017-11-22 LAB — ABO/RH: ABO/RH(D): O POS

## 2017-11-22 NOTE — Pre-Procedure Instructions (Signed)
Left chart with Clent Ridges. RN to follow up with final EKG results.

## 2017-11-24 ENCOUNTER — Inpatient Hospital Stay (HOSPITAL_COMMUNITY): Payer: Medicare Other

## 2017-11-24 ENCOUNTER — Inpatient Hospital Stay (HOSPITAL_COMMUNITY)
Admission: RE | Admit: 2017-11-24 | Discharge: 2017-11-25 | DRG: 470 | Disposition: A | Discharge status: Home / Self Care | Payer: Medicare Other | Attending: Orthopedic Surgery | Admitting: Orthopedic Surgery

## 2017-11-24 ENCOUNTER — Inpatient Hospital Stay (HOSPITAL_COMMUNITY): Payer: Medicare Other | Admitting: Anesthesiology

## 2017-11-24 ENCOUNTER — Other Ambulatory Visit: Payer: Self-pay

## 2017-11-24 ENCOUNTER — Encounter (HOSPITAL_COMMUNITY): Payer: Self-pay | Admitting: *Deleted

## 2017-11-24 ENCOUNTER — Encounter (HOSPITAL_COMMUNITY)
Admission: RE | Disposition: A | Discharge status: Home / Self Care | Payer: Self-pay | Source: Home / Self Care | Attending: Orthopedic Surgery

## 2017-11-24 DIAGNOSIS — Z09 Encounter for follow-up examination after completed treatment for conditions other than malignant neoplasm: Secondary | ICD-10-CM

## 2017-11-24 DIAGNOSIS — Z90710 Acquired absence of both cervix and uterus: Secondary | ICD-10-CM | POA: Diagnosis not present

## 2017-11-24 DIAGNOSIS — E669 Obesity, unspecified: Secondary | ICD-10-CM | POA: Diagnosis present

## 2017-11-24 DIAGNOSIS — H9191 Unspecified hearing loss, right ear: Secondary | ICD-10-CM | POA: Diagnosis present

## 2017-11-24 DIAGNOSIS — M1611 Unilateral primary osteoarthritis, right hip: Secondary | ICD-10-CM | POA: Diagnosis present

## 2017-11-24 DIAGNOSIS — K760 Fatty (change of) liver, not elsewhere classified: Secondary | ICD-10-CM | POA: Diagnosis present

## 2017-11-24 DIAGNOSIS — Z87891 Personal history of nicotine dependence: Secondary | ICD-10-CM

## 2017-11-24 DIAGNOSIS — E119 Type 2 diabetes mellitus without complications: Secondary | ICD-10-CM | POA: Diagnosis present

## 2017-11-24 DIAGNOSIS — Z7983 Long term (current) use of bisphosphonates: Secondary | ICD-10-CM | POA: Diagnosis not present

## 2017-11-24 DIAGNOSIS — I10 Essential (primary) hypertension: Secondary | ICD-10-CM | POA: Diagnosis present

## 2017-11-24 DIAGNOSIS — Z6832 Body mass index (BMI) 32.0-32.9, adult: Secondary | ICD-10-CM | POA: Diagnosis not present

## 2017-11-24 DIAGNOSIS — Z888 Allergy status to other drugs, medicaments and biological substances status: Secondary | ICD-10-CM | POA: Diagnosis not present

## 2017-11-24 DIAGNOSIS — M5030 Other cervical disc degeneration, unspecified cervical region: Secondary | ICD-10-CM | POA: Diagnosis present

## 2017-11-24 DIAGNOSIS — Z79899 Other long term (current) drug therapy: Secondary | ICD-10-CM | POA: Diagnosis not present

## 2017-11-24 DIAGNOSIS — M25551 Pain in right hip: Secondary | ICD-10-CM

## 2017-11-24 DIAGNOSIS — Z79891 Long term (current) use of opiate analgesic: Secondary | ICD-10-CM

## 2017-11-24 HISTORY — PX: TOTAL HIP ARTHROPLASTY: SHX124

## 2017-11-24 LAB — TYPE AND SCREEN
ABO/RH(D): O POS
ANTIBODY SCREEN: NEGATIVE

## 2017-11-24 LAB — GLUCOSE, CAPILLARY
GLUCOSE-CAPILLARY: 141 mg/dL — AB (ref 70–99)
Glucose-Capillary: 139 mg/dL — ABNORMAL HIGH (ref 70–99)
Glucose-Capillary: 132 mg/dL — ABNORMAL HIGH (ref 70–99)

## 2017-11-24 SURGERY — ARTHROPLASTY, HIP, TOTAL, ANTERIOR APPROACH
Anesthesia: Monitor Anesthesia Care | Laterality: Right

## 2017-11-24 MED ORDER — KETOROLAC TROMETHAMINE 30 MG/ML IJ SOLN
INTRAMUSCULAR | Status: AC
  Filled 2017-11-24: qty 1

## 2017-11-24 MED ORDER — DOCUSATE SODIUM 100 MG PO CAPS
100.0000 mg | ORAL_CAPSULE | Freq: Two times a day (BID) | ORAL | Status: DC
  Administered 2017-11-24 – 2017-11-25 (×2): 100 mg via ORAL
  Filled 2017-11-24 (×2): qty 1

## 2017-11-24 MED ORDER — PHENYLEPHRINE HCL 10 MG/ML IJ SOLN
INTRAMUSCULAR | Status: AC
  Filled 2017-11-24: qty 1

## 2017-11-24 MED ORDER — ONDANSETRON HCL 4 MG PO TABS: 4.0000 mg | ORAL_TABLET | Freq: Four times a day (QID) | ORAL | Status: DC | PRN

## 2017-11-24 MED ORDER — OXYCODONE HCL 5 MG PO TABS: 5.0000 mg | ORAL_TABLET | Freq: Once | ORAL | Status: DC | PRN

## 2017-11-24 MED ORDER — AMITRIPTYLINE HCL 50 MG PO TABS
50.0000 mg | ORAL_TABLET | Freq: Every day | ORAL | Status: DC
  Administered 2017-11-24: 50 mg via ORAL
  Filled 2017-11-24: qty 1

## 2017-11-24 MED ORDER — MENTHOL 3 MG MT LOZG: 1.0000 | LOZENGE | OROMUCOSAL | Status: DC | PRN

## 2017-11-24 MED ORDER — PHENOL 1.4 % MT LIQD: 1.0000 | OROMUCOSAL | Status: DC | PRN

## 2017-11-24 MED ORDER — TRANEXAMIC ACID-NACL 1000-0.7 MG/100ML-% IV SOLN
INTRAVENOUS | Status: AC
  Filled 2017-11-24: qty 100

## 2017-11-24 MED ORDER — PROPRANOLOL HCL ER 80 MG PO CP24
80.0000 mg | ORAL_CAPSULE | Freq: Every day | ORAL | Status: DC
  Administered 2017-11-24: 80 mg via ORAL
  Filled 2017-11-24 (×2): qty 1

## 2017-11-24 MED ORDER — POVIDONE-IODINE 10 % EX SWAB: 2.0000 "application " | Freq: Once | CUTANEOUS | Status: DC

## 2017-11-24 MED ORDER — DEXAMETHASONE SODIUM PHOSPHATE 10 MG/ML IJ SOLN
INTRAMUSCULAR | Status: DC | PRN
  Administered 2017-11-24: 10 mg via INTRAVENOUS

## 2017-11-24 MED ORDER — CEFAZOLIN SODIUM-DEXTROSE 2-4 GM/100ML-% IV SOLN
INTRAVENOUS | Status: AC
  Filled 2017-11-24: qty 100

## 2017-11-24 MED ORDER — POLYETHYLENE GLYCOL 3350 17 G PO PACK: 17.0000 g | PACK | Freq: Every day | ORAL | Status: DC | PRN

## 2017-11-24 MED ORDER — PROPOFOL 10 MG/ML IV BOLUS
INTRAVENOUS | Status: AC
  Filled 2017-11-24: qty 20

## 2017-11-24 MED ORDER — OXYCODONE HCL 5 MG PO TABS
5.0000 mg | ORAL_TABLET | ORAL | Status: DC | PRN
  Administered 2017-11-24: 5 mg via ORAL
  Administered 2017-11-25 (×2): 10 mg via ORAL
  Filled 2017-11-24 (×3): qty 2
  Filled 2017-11-24: qty 1

## 2017-11-24 MED ORDER — MIDAZOLAM HCL 2 MG/2ML IJ SOLN
INTRAMUSCULAR | Status: AC
  Filled 2017-11-24: qty 2

## 2017-11-24 MED ORDER — METOCLOPRAMIDE HCL 5 MG PO TABS: 5.0000 mg | ORAL_TABLET | Freq: Three times a day (TID) | ORAL | Status: DC | PRN

## 2017-11-24 MED ORDER — METHOCARBAMOL 500 MG IVPB - SIMPLE MED
500.0000 mg | Freq: Four times a day (QID) | INTRAVENOUS | Status: DC | PRN
  Filled 2017-11-24: qty 50

## 2017-11-24 MED ORDER — BUPIVACAINE HCL (PF) 0.25 % IJ SOLN
INTRAMUSCULAR | Status: AC
  Filled 2017-11-24: qty 30

## 2017-11-24 MED ORDER — ONDANSETRON HCL 4 MG/2ML IJ SOLN
INTRAMUSCULAR | Status: AC
  Filled 2017-11-24: qty 2

## 2017-11-24 MED ORDER — WATER FOR IRRIGATION, STERILE IR SOLN
Status: DC | PRN
  Administered 2017-11-24: 2000 mL

## 2017-11-24 MED ORDER — SODIUM CHLORIDE 0.9 % IR SOLN
Status: DC | PRN
  Administered 2017-11-24: 3000 mL

## 2017-11-24 MED ORDER — DIPHENHYDRAMINE HCL 12.5 MG/5ML PO ELIX: 12.5000 mg | ORAL_SOLUTION | ORAL | Status: DC | PRN

## 2017-11-24 MED ORDER — MIDAZOLAM HCL 5 MG/5ML IJ SOLN
INTRAMUSCULAR | Status: DC | PRN
  Administered 2017-11-24 (×2): 1 mg via INTRAVENOUS

## 2017-11-24 MED ORDER — ACETAMINOPHEN 325 MG PO TABS: 325.0000 mg | ORAL_TABLET | Freq: Four times a day (QID) | ORAL | Status: DC | PRN

## 2017-11-24 MED ORDER — ACETAMINOPHEN 10 MG/ML IV SOLN
1000.0000 mg | INTRAVENOUS | Status: AC
  Administered 2017-11-24: 1000 mg via INTRAVENOUS

## 2017-11-24 MED ORDER — PROPOFOL 500 MG/50ML IV EMUL
INTRAVENOUS | Status: DC | PRN
  Administered 2017-11-24: 30 mg via INTRAVENOUS
  Administered 2017-11-24: 20 mg via INTRAVENOUS

## 2017-11-24 MED ORDER — DEXAMETHASONE SODIUM PHOSPHATE 10 MG/ML IJ SOLN
10.0000 mg | Freq: Once | INTRAMUSCULAR | Status: AC
  Administered 2017-11-25: 10 mg via INTRAVENOUS
  Filled 2017-11-24: qty 1

## 2017-11-24 MED ORDER — CEFAZOLIN SODIUM-DEXTROSE 2-4 GM/100ML-% IV SOLN
2.0000 g | Freq: Four times a day (QID) | INTRAVENOUS | Status: AC
  Administered 2017-11-24 – 2017-11-25 (×2): 2 g via INTRAVENOUS
  Filled 2017-11-24 (×2): qty 100

## 2017-11-24 MED ORDER — PROPOFOL 500 MG/50ML IV EMUL
INTRAVENOUS | Status: DC | PRN
  Administered 2017-11-24: 75 ug/kg/min via INTRAVENOUS

## 2017-11-24 MED ORDER — CEFAZOLIN SODIUM-DEXTROSE 2-4 GM/100ML-% IV SOLN
2.0000 g | INTRAVENOUS | Status: AC
  Administered 2017-11-24: 2 g via INTRAVENOUS

## 2017-11-24 MED ORDER — POVIDONE-IODINE 7.5 % EX SOLN
Freq: Once | CUTANEOUS | Status: AC
  Administered 2017-11-24: 11:00:00 via TOPICAL

## 2017-11-24 MED ORDER — OXYCODONE HCL 5 MG PO TABS
10.0000 mg | ORAL_TABLET | ORAL | Status: DC | PRN
  Administered 2017-11-25: 10 mg via ORAL

## 2017-11-24 MED ORDER — ONDANSETRON HCL 4 MG/2ML IJ SOLN
INTRAMUSCULAR | Status: DC | PRN
  Administered 2017-11-24: 4 mg via INTRAVENOUS

## 2017-11-24 MED ORDER — ISOPROPYL ALCOHOL 70 % SOLN
Status: AC
  Filled 2017-11-24: qty 480

## 2017-11-24 MED ORDER — LACTATED RINGERS IV SOLN
INTRAVENOUS | Status: DC
  Administered 2017-11-24 (×2): via INTRAVENOUS

## 2017-11-24 MED ORDER — METOCLOPRAMIDE HCL 5 MG/ML IJ SOLN: 5.0000 mg | Freq: Three times a day (TID) | INTRAMUSCULAR | Status: DC | PRN

## 2017-11-24 MED ORDER — SODIUM CHLORIDE 0.9 % IV SOLN: INTRAVENOUS | Status: DC | PRN

## 2017-11-24 MED ORDER — KETOROLAC TROMETHAMINE 30 MG/ML IJ SOLN
INTRAMUSCULAR | Status: DC | PRN
  Administered 2017-11-24: 30 mg via INTRAVENOUS

## 2017-11-24 MED ORDER — METFORMIN HCL ER 500 MG PO TB24
1000.0000 mg | ORAL_TABLET | Freq: Two times a day (BID) | ORAL | Status: DC
  Administered 2017-11-24 – 2017-11-25 (×2): 1000 mg via ORAL
  Filled 2017-11-24 (×2): qty 2

## 2017-11-24 MED ORDER — SODIUM CHLORIDE 0.9 % IJ SOLN
INTRAMUSCULAR | Status: AC
  Filled 2017-11-24: qty 50

## 2017-11-24 MED ORDER — SODIUM CHLORIDE 0.9 % IV SOLN: INTRAVENOUS | Status: DC

## 2017-11-24 MED ORDER — SODIUM CHLORIDE 0.9 % IV SOLN
INTRAVENOUS | Status: DC
  Administered 2017-11-24 – 2017-11-25 (×2): via INTRAVENOUS

## 2017-11-24 MED ORDER — TIZANIDINE HCL 2 MG PO TABS
2.0000 mg | ORAL_TABLET | Freq: Every day | ORAL | Status: DC
  Administered 2017-11-24: 2 mg via ORAL
  Filled 2017-11-24: qty 1

## 2017-11-24 MED ORDER — FENTANYL CITRATE (PF) 100 MCG/2ML IJ SOLN: 25.0000 ug | INTRAMUSCULAR | Status: DC | PRN

## 2017-11-24 MED ORDER — ONDANSETRON HCL 4 MG/2ML IJ SOLN: 4.0000 mg | Freq: Once | INTRAMUSCULAR | Status: DC | PRN

## 2017-11-24 MED ORDER — INSULIN ASPART 100 UNIT/ML ~~LOC~~ SOLN
0.0000 [IU] | Freq: Three times a day (TID) | SUBCUTANEOUS | Status: DC
  Administered 2017-11-24: 2 [IU] via SUBCUTANEOUS
  Administered 2017-11-25: 3 [IU] via SUBCUTANEOUS
  Administered 2017-11-25: 2 [IU] via SUBCUTANEOUS

## 2017-11-24 MED ORDER — TRANEXAMIC ACID-NACL 1000-0.7 MG/100ML-% IV SOLN
1000.0000 mg | INTRAVENOUS | Status: AC
  Administered 2017-11-24: 1000 mg via INTRAVENOUS

## 2017-11-24 MED ORDER — ASPIRIN 81 MG PO CHEW
81.0000 mg | CHEWABLE_TABLET | Freq: Two times a day (BID) | ORAL | Status: DC
  Administered 2017-11-24 – 2017-11-25 (×2): 81 mg via ORAL
  Filled 2017-11-24 (×2): qty 1

## 2017-11-24 MED ORDER — OXYCODONE HCL 5 MG/5ML PO SOLN
5.0000 mg | Freq: Once | ORAL | Status: DC | PRN
  Filled 2017-11-24: qty 5

## 2017-11-24 MED ORDER — ROSUVASTATIN CALCIUM 10 MG PO TABS: 10.0000 mg | ORAL_TABLET | ORAL | Status: DC

## 2017-11-24 MED ORDER — PROPOFOL 10 MG/ML IV BOLUS
INTRAVENOUS | Status: AC
  Filled 2017-11-24: qty 40

## 2017-11-24 MED ORDER — ACETAMINOPHEN 10 MG/ML IV SOLN
INTRAVENOUS | Status: AC
  Filled 2017-11-24: qty 100

## 2017-11-24 MED ORDER — HYDROMORPHONE HCL 1 MG/ML IJ SOLN
0.5000 mg | INTRAMUSCULAR | Status: DC | PRN
  Administered 2017-11-25: 0.5 mg via INTRAVENOUS
  Filled 2017-11-24: qty 1

## 2017-11-24 MED ORDER — METHOCARBAMOL 500 MG PO TABS
500.0000 mg | ORAL_TABLET | Freq: Four times a day (QID) | ORAL | Status: DC | PRN
  Administered 2017-11-25 (×2): 500 mg via ORAL
  Filled 2017-11-24 (×3): qty 1

## 2017-11-24 MED ORDER — SODIUM CHLORIDE 0.9 % IV SOLN
INTRAVENOUS | Status: DC | PRN
  Administered 2017-11-24: 20 ug/min via INTRAVENOUS

## 2017-11-24 MED ORDER — ALUM & MAG HYDROXIDE-SIMETH 200-200-20 MG/5ML PO SUSP: 30.0000 mL | ORAL | Status: DC | PRN

## 2017-11-24 MED ORDER — DEXAMETHASONE SODIUM PHOSPHATE 10 MG/ML IJ SOLN
INTRAMUSCULAR | Status: AC
  Filled 2017-11-24: qty 1

## 2017-11-24 MED ORDER — ONDANSETRON HCL 4 MG/2ML IJ SOLN: 4.0000 mg | Freq: Four times a day (QID) | INTRAMUSCULAR | Status: DC | PRN

## 2017-11-24 MED ORDER — ISOPROPYL ALCOHOL 70 % SOLN
Status: DC | PRN
  Administered 2017-11-24: 1 via TOPICAL

## 2017-11-24 MED ORDER — SENNA 8.6 MG PO TABS
1.0000 | ORAL_TABLET | Freq: Two times a day (BID) | ORAL | Status: DC
  Administered 2017-11-24 – 2017-11-25 (×2): 8.6 mg via ORAL
  Filled 2017-11-24 (×2): qty 1

## 2017-11-24 MED ORDER — BUPIVACAINE IN DEXTROSE 0.75-8.25 % IT SOLN
INTRATHECAL | Status: DC | PRN
  Administered 2017-11-24: 1.8 mL via INTRATHECAL

## 2017-11-24 MED ORDER — SODIUM CHLORIDE 0.9 % IJ SOLN
INTRAMUSCULAR | Status: DC | PRN
  Administered 2017-11-24: 50 mL via INTRAVENOUS

## 2017-11-24 SURGICAL SUPPLY — 55 items
ACETAB CUP W GRIPTION 54MM (Plate) ×1 IMPLANT
ACETAB CUP W/GRIPTION 54 (Plate) ×2 IMPLANT
ADH SKN CLS APL DERMABOND .7 (GAUZE/BANDAGES/DRESSINGS) ×1
ARTICULEZE HEAD (Hips) ×3 IMPLANT
BAG DECANTER FOR FLEXI CONT (MISCELLANEOUS) IMPLANT
BAG SPEC THK2 15X12 ZIP CLS (MISCELLANEOUS)
BAG ZIPLOCK 12X15 (MISCELLANEOUS) IMPLANT
CHLORAPREP W/TINT 26ML (MISCELLANEOUS) ×3 IMPLANT
CLOTH BEACON ORANGE TIMEOUT ST (SAFETY) ×3 IMPLANT
COVER PERINEAL POST (MISCELLANEOUS) ×3 IMPLANT
COVER SURGICAL LIGHT HANDLE (MISCELLANEOUS) ×3 IMPLANT
COVER WAND RF STERILE (DRAPES) ×2 IMPLANT
CUP ACETAB W/GRIPTION 54 (Plate) IMPLANT
DECANTER SPIKE VIAL GLASS SM (MISCELLANEOUS) ×1 IMPLANT
DERMABOND ADVANCED (GAUZE/BANDAGES/DRESSINGS) ×2
DERMABOND ADVANCED .7 DNX12 (GAUZE/BANDAGES/DRESSINGS) ×2 IMPLANT
DRAPE SHEET LG 3/4 BI-LAMINATE (DRAPES) ×9 IMPLANT
DRAPE STERI IOBAN 125X83 (DRAPES) ×3 IMPLANT
DRAPE U-SHAPE 47X51 STRL (DRAPES) ×6 IMPLANT
DRSG AQUACEL AG ADV 3.5X 6 (GAUZE/BANDAGES/DRESSINGS) ×2 IMPLANT
DRSG AQUACEL AG ADV 3.5X10 (GAUZE/BANDAGES/DRESSINGS) ×3 IMPLANT
ELECT PENCIL ROCKER SW 15FT (MISCELLANEOUS) ×3 IMPLANT
ELECT REM PT RETURN 15FT ADLT (MISCELLANEOUS) ×3 IMPLANT
GAUZE SPONGE 4X4 12PLY STRL (GAUZE/BANDAGES/DRESSINGS) ×3 IMPLANT
GLOVE BIO SURGEON STRL SZ8.5 (GLOVE) ×6 IMPLANT
GLOVE BIOGEL PI IND STRL 8.5 (GLOVE) ×1 IMPLANT
GLOVE BIOGEL PI INDICATOR 8.5 (GLOVE) ×2
GOWN SPEC L3 XXLG W/TWL (GOWN DISPOSABLE) ×3 IMPLANT
HANDPIECE INTERPULSE COAX TIP (DISPOSABLE) ×3
HEAD ARTICULEZE (Hips) IMPLANT
HOLDER FOLEY CATH W/STRAP (MISCELLANEOUS) ×3 IMPLANT
HOOD PEEL AWAY FLYTE STAYCOOL (MISCELLANEOUS) ×12 IMPLANT
LINER NEUTRAL 54X36MM PLUS 4 (Hips) ×2 IMPLANT
MARKER SKIN DUAL TIP RULER LAB (MISCELLANEOUS) ×3 IMPLANT
NDL SPNL 18GX3.5 QUINCKE PK (NEEDLE) ×1 IMPLANT
NEEDLE SPNL 18GX3.5 QUINCKE PK (NEEDLE) ×3 IMPLANT
PACK ANTERIOR HIP CUSTOM (KITS) ×3 IMPLANT
SAW OSC TIP CART 19.5X105X1.3 (SAW) ×3 IMPLANT
SEALER BIPOLAR AQUA 6.0 (INSTRUMENTS) ×3 IMPLANT
SET HNDPC FAN SPRY TIP SCT (DISPOSABLE) ×1 IMPLANT
STEM TRI LOC BPS SZ6 W GRIPTON IMPLANT
SUT ETHIBOND NAB CT1 #1 30IN (SUTURE) ×6 IMPLANT
SUT MNCRL AB 3-0 PS2 18 (SUTURE) ×3 IMPLANT
SUT MON AB 2-0 CT1 36 (SUTURE) ×6 IMPLANT
SUT STRATAFIX PDO 1 14 VIOLET (SUTURE) ×3
SUT STRATFX PDO 1 14 VIOLET (SUTURE) ×1
SUT VIC AB 2-0 CT1 27 (SUTURE) ×3
SUT VIC AB 2-0 CT1 TAPERPNT 27 (SUTURE) ×1 IMPLANT
SUTURE STRATFX PDO 1 14 VIOLET (SUTURE) ×1 IMPLANT
SYR 50ML LL SCALE MARK (SYRINGE) ×3 IMPLANT
TRAY FOLEY CATH 14FRSI W/METER (CATHETERS) ×2 IMPLANT
TRAY FOLEY MTR SLVR 16FR STAT (SET/KITS/TRAYS/PACK) IMPLANT
TRI LOC BPS SZ 6 W GRIPTON ×3 IMPLANT
WATER STERILE IRR 1000ML POUR (IV SOLUTION) ×5 IMPLANT
YANKAUER SUCT BULB TIP 10FT TU (MISCELLANEOUS) ×3 IMPLANT

## 2017-11-24 NOTE — Discharge Instructions (Signed)
°Dr. Amiel Sharrow °Joint Replacement Specialist °[facility] °[address] °[place], [place] [address] °[phone] ° ° °TOTAL HIP REPLACEMENT POSTOPERATIVE DIRECTIONS ° ° ° °Hip Rehabilitation, Guidelines Following Surgery  ° °WEIGHT BEARING °Weight bearing as tolerated with assist device (walker, cane, etc) as directed, use it as long as suggested by your surgeon or therapist, typically at least 4-6 weeks. ° °The results of a hip operation are greatly improved after range of motion and muscle strengthening exercises. Follow all safety measures which are given to protect your hip. If any of these exercises cause increased pain or swelling in your joint, decrease the amount until you are comfortable again. Then slowly increase the exercises. Call your caregiver if you have problems or questions.  ° °HOME CARE INSTRUCTIONS  °Most of the following instructions are designed to prevent the dislocation of your new hip.  °Remove items at home which could result in a fall. This includes throw rugs or furniture in walking pathways.  °Continue medications as instructed at time of discharge. °· You may have some home medications which will be placed on hold until you complete the course of blood thinner medication. °· You may start showering once you are discharged home. Do not remove your dressing. °Do not put on socks or shoes without following the instructions of your caregivers.   °Sit on chairs with arms. Use the chair arms to help push yourself up when arising.  °Arrange for the use of a toilet seat elevator so you are not sitting low.  °· Walk with walker as instructed.  °You may resume a sexual relationship in one month or when given the OK by your caregiver.  °Use walker as long as suggested by your caregivers.  °You may put full weight on your legs and walk as much as is comfortable. °Avoid periods of inactivity such as sitting longer than an hour when not asleep. This helps prevent  blood clots.  °You may return to work once you are cleared by your surgeon.  °Do not drive a car for 6 weeks or until released by your surgeon.  °Do not drive while taking narcotics.  °Wear elastic stockings for two weeks following surgery during the day but you may remove then at night.  °Make sure you keep all of your appointments after your operation with all of your doctors and caregivers. You should call the office at the above phone number and make an appointment for approximately two weeks after the date of your surgery. °Please pick up a stool softener and laxative for home use as long as you are requiring pain medications. °· ICE to the affected hip every three hours for 30 minutes at a time and then as needed for pain and swelling. Continue to use ice on the hip for pain and swelling from surgery. You may notice swelling that will progress down to the foot and ankle.  This is normal after surgery.  Elevate the leg when you are not up walking on it.   °It is important for you to complete the blood thinner medication as prescribed by your doctor. °· Continue to use the breathing machine which will help keep your temperature down.  It is common for your temperature to cycle up and down following surgery, especially at night when you are not up moving around and exerting yourself.  The breathing machine keeps your lungs expanded and your temperature down. ° °RANGE OF MOTION AND STRENGTHENING EXERCISES  °These exercises are   designed to help you keep full movement of your hip joint. Follow your caregiver's or physical therapist's instructions. Perform all exercises about fifteen times, three times per day or as directed. Exercise both hips, even if you have had only one joint replacement. These exercises can be done on a training (exercise) mat, on the floor, on a table or on a bed. Use whatever works the best and is most comfortable for you. Use music or television while you are exercising so that the exercises  are a pleasant break in your day. This will make your life better with the exercises acting as a break in routine you can look forward to.  °Lying on your back, slowly slide your foot toward your buttocks, raising your knee up off the floor. Then slowly slide your foot back down until your leg is straight again.  °Lying on your back spread your legs as far apart as you can without causing discomfort.  °Lying on your side, raise your upper leg and foot straight up from the floor as far as is comfortable. Slowly lower the leg and repeat.  °Lying on your back, tighten up the muscle in the front of your thigh (quadriceps muscles). You can do this by keeping your leg straight and trying to raise your heel off the floor. This helps strengthen the largest muscle supporting your knee.  °Lying on your back, tighten up the muscles of your buttocks both with the legs straight and with the knee bent at a comfortable angle while keeping your heel on the floor.  ° °SKILLED REHAB INSTRUCTIONS: °If the patient is transferred to a skilled rehab facility following release from the hospital, a list of the current medications will be sent to the facility for the patient to continue.  When discharged from the skilled rehab facility, please have the facility set up the patient's Home Health Physical Therapy prior to being released. Also, the skilled facility will be responsible for providing the patient with their medications at time of release from the facility to include their pain medication and their blood thinner medication. If the patient is still at the rehab facility at time of the two week follow up appointment, the skilled rehab facility will also need to assist the patient in arranging follow up appointment in our office and any transportation needs. ° °MAKE SURE YOU:  °Understand these instructions.  °Will watch your condition.  °Will get help right away if you are not doing well or get worse. ° °Pick up stool softner and  laxative for home use following surgery while on pain medications. °Do not remove your dressing. °The dressing is waterproof--it is OK to take showers. °Continue to use ice for pain and swelling after surgery. °Do not use any lotions or creams on the incision until instructed by your surgeon. °Total Hip Protocol. ° ° °

## 2017-11-24 NOTE — Interval H&P Note (Signed)
History and Physical Interval Note:  11/24/2017 1:21 PM  Sierra [name]  has presented today for surgery, with the diagnosis of Degererative joint disease right hip  The various methods of treatment have been discussed with the patient and family. After consideration of risks, benefits and other options for treatment, the patient has consented to  Procedure(s): TOTAL HIP ARTHROPLASTY ANTERIOR APPROACH (Right) as a surgical intervention .  The patient's history has been reviewed, patient examined, no change in status, stable for surgery.  I have reviewed the patient's chart and labs.  Questions were answered to the patient's satisfaction.     [name]

## 2017-11-24 NOTE — Op Note (Signed)
OPERATIVE REPORT  SURGEON: Rod Can, MD   ASSISTANT: Nehemiah Massed, PA-C. Byrd Hesselbach, RNFA.  PREOPERATIVE DIAGNOSIS: Right hip arthritis.   POSTOPERATIVE DIAGNOSIS: Right hip arthritis.   PROCEDURE: Right total hip arthroplasty, anterior approach.   IMPLANTS: DePuy Tri Lock stem, size 6, hi offset. DePuy Pinnacle Cup, size 54 mm. DePuy Altrx liner, size 36 by 54 mm, +4 neutral. DePuy M-Spec metal head ball, size 36 + 5 mm.  ANESTHESIA:  Spinal  ESTIMATED BLOOD LOSS:-200 mL    ANTIBIOTICS: 2 g Ancef.  DRAINS: None.  COMPLICATIONS: None.   CONDITION: PACU - hemodynamically stable.   BRIEF CLINICAL NOTE: Sierra [name] is a 77 y.o. female with a long-standing history of Right hip arthritis. After failing conservative management, the patient was indicated for total hip arthroplasty. The risks, benefits, and alternatives to the procedure were explained, and the patient elected to proceed.  PROCEDURE IN DETAIL: Surgical site was marked by myself in the pre-op holding area. Once inside the operating room, spinal anesthesia was obtained, and a foley catheter was inserted. The patient was then positioned on the [place] table. All bony prominences were well padded. The hip was prepped and draped in the normal sterile surgical fashion. A time-out was called verifying side and site of surgery. The patient received IV antibiotics within 60 minutes of beginning the procedure.  The direct anterior approach to the hip was performed through the Hueter interval. Lateral femoral circumflex vessels were treated with the Auqumantys. The anterior capsule was exposed and an inverted T capsulotomy was made.The femoral neck cut was made to the level of the templated cut. A corkscrew was placed into the head and the head was removed. The femoral head was found to have eburnated bone. The head was passed to the back table and was measured.  Acetabular exposure was achieved, and the  pulvinar and labrum were excised. Sequential reaming of the acetabulum was then performed up to a size 53 mm reamer. A 54 mm cup was then opened and impacted into place at approximately 40 degrees of abduction and 20 degrees of anteversion. The final polyethylene liner was impacted into place and acetabular osteophytes were removed.   I then gained femoral exposure taking care to protect the abductors and greater trochanter. This was performed using standard external rotation, extension, and adduction. The capsule was peeled off the inner aspect of the greater trochanter, taking care to preserve the short external rotators. A cookie cutter was used to enter the femoral canal, and then the femoral canal finder was placed. Sequential broaching was performed up to a size 6. Calcar planer was used on the femoral neck remnant. I placed a hi offset neck and a trial head ball. The hip was reduced. Leg lengths and offset were checked fluoroscopically. The hip was dislocated and trial components were removed. The final implants were placed, and the hip was reduced.  Fluoroscopy was used to confirm component position and leg lengths. At 90 degrees of external rotation and full extension, the hip was stable to an anterior directed force.  The wound was copiously irrigated with normal saline using pulse lavage. Marcaine solution was injected into the periarticular soft tissue. The wound was closed in layers using #1 Vicryl and V-Loc for the fascia, 2-0 Vicryl for the subcutaneous fat, 2-0 Monocryl for the deep dermal layer, 3-0 running Monocryl subcuticular stitch, and Dermabond for the skin. Once the glue was fully dried, an Aquacell Ag dressing was applied. The patient was transported  to the recovery room in stable condition. Sponge, needle, and instrument counts were correct at the end of the case x2. The patient tolerated the procedure well and there were no known complications.  Please note that a  surgical assistant was a medical necessity for this procedure to perform it in a safe and expeditious manner. Assistant was necessary to provide appropriate retraction of vital neurovascular structures, to prevent femoral fracture, and to allow for anatomic placement of the prosthesis.

## 2017-11-24 NOTE — Anesthesia Procedure Notes (Signed)
Date/Time: 11/24/2017 1:26 PM Performed by: Glory Buff, CRNA Oxygen Delivery Method: Simple face mask

## 2017-11-24 NOTE — Anesthesia Postprocedure Evaluation (Signed)
Anesthesia Post Note  Patient: Sierra [name]  Procedure(s) Performed: TOTAL HIP ARTHROPLASTY ANTERIOR APPROACH (Right )     Patient location during evaluation: PACU Anesthesia Type: MAC Level of consciousness: oriented and awake and alert Pain management: pain level controlled Vital Signs Assessment: post-procedure vital signs reviewed and stable Respiratory status: spontaneous breathing, respiratory function stable and patient connected to nasal cannula oxygen Cardiovascular status: blood pressure returned to baseline and stable Postop Assessment: no headache, no backache and no apparent nausea or vomiting Anesthetic complications: no    Last Vitals:  Vitals:   11/24/17 1600 11/24/17 1615  BP: (!) 107/54 126/61  Pulse: 63 (!) 57  Resp: 16 13  Temp:    SpO2: 100% 100%    Last Pain:  Vitals:   11/24/17 1615  TempSrc:   PainSc: 0-No pain    LLE Motor Response: Purposeful movement (11/24/17 1615)   RLE Motor Response: Purposeful movement (11/24/17 1615)   L Sensory Level: L2-Upper inner thigh, upper buttock (11/24/17 1615) R Sensory Level: L2-Upper inner thigh, upper buttock (11/24/17 1615)  Mili Piltz COKER

## 2017-11-24 NOTE — Transfer of Care (Signed)
Immediate Anesthesia Transfer of Care Note  Patient: Sierra [name]  Procedure(s) Performed: TOTAL HIP ARTHROPLASTY ANTERIOR APPROACH (Right )  Patient Location: PACU  Anesthesia Type:Spinal  Level of Consciousness: awake, alert  and oriented  Airway & Oxygen Therapy: Patient Spontanous Breathing and Patient connected to face mask oxygen  Post-op Assessment: Report given to RN and Post -op Vital signs reviewed and stable  Post vital signs: Reviewed and stable  Last Vitals:  Vitals Value Taken Time  BP    Temp    Pulse    Resp 13 11/24/2017  3:50 PM  SpO2    Vitals shown include unvalidated device data.  Last Pain:  Vitals:   11/24/17 1114  TempSrc: Oral         Complications: No apparent anesthesia complications

## 2017-11-24 NOTE — Anesthesia Procedure Notes (Signed)
Spinal  Patient location during procedure: OR Start time: 11/24/2017 1:29 PM End time: 11/24/2017 1:33 PM Staffing Anesthesiologist: Roberts Gaudy, MD Resident/CRNA: Glory Buff, CRNA Performed: resident/CRNA  Preanesthetic Checklist Completed: patient identified, site marked, surgical consent, pre-op evaluation, timeout performed, IV checked, risks and benefits discussed and monitors and equipment checked Spinal Block Patient position: sitting Prep: site prepped and draped and DuraPrep Patient monitoring: heart rate, continuous pulse ox and blood pressure Approach: midline Location: L3-4 Needle Needle type: Pencan  Needle gauge: 24 G Needle length: 9 cm Needle insertion depth: 6 cm Assessment Sensory level: T6 Additional Notes Kit expiration date checked and verified.  Sterile prep and drape, skin local with 1% lidocaine, - heme, -paraesethesia, + CSF pre and post injection, patient well.

## 2017-11-24 NOTE — Anesthesia Preprocedure Evaluation (Addendum)
Anesthesia Evaluation  Patient identified by MRN, date of birth, ID band Patient awake    Reviewed: Allergy & Precautions, NPO status , Patient's Chart, lab work & pertinent test results  Airway Mallampati: III  TM Distance: >3 FB Neck ROM: Full    Dental  (+) Edentulous Upper, Dental Advisory Given   Pulmonary former smoker,    breath sounds clear to auscultation       Cardiovascular hypertension,  Rhythm:Regular Rate:Normal     Neuro/Psych    GI/Hepatic   Endo/Other  diabetes  Renal/GU      Musculoskeletal   Abdominal (+) + obese,   Peds  Hematology   Anesthesia Other Findings   Reproductive/Obstetrics                             Anesthesia Physical Anesthesia Plan  ASA: III  Anesthesia Plan: Spinal and MAC   Post-op Pain Management:    Induction: Intravenous  PONV Risk Score and Plan: Ondansetron and Propofol infusion  Airway Management Planned: Natural Airway and Simple Face Mask  Additional Equipment:   Intra-op Plan:   Post-operative Plan:   Informed Consent: I have reviewed the patients History and Physical, chart, labs and discussed the procedure including the risks, benefits and alternatives for the proposed anesthesia with the patient or authorized representative who has indicated his/her understanding and acceptance.   Dental advisory given  Plan Discussed with: CRNA and Anesthesiologist  Anesthesia Plan Comments:        Anesthesia Quick Evaluation

## 2017-11-25 ENCOUNTER — Encounter (HOSPITAL_COMMUNITY): Payer: Self-pay | Admitting: Orthopedic Surgery

## 2017-11-25 LAB — BASIC METABOLIC PANEL
ANION GAP: 7 (ref 5–15)
BUN: 20 mg/dL (ref 8–23)
CALCIUM: 8.9 mg/dL (ref 8.9–10.3)
CO2: 25 mmol/L (ref 22–32)
CREATININE: 1 mg/dL (ref 0.44–1.00)
Chloride: 108 mmol/L (ref 98–111)
GFR calc non Af Amer: 54 mL/min — ABNORMAL LOW (ref >60)
Glucose, Bld: 293 mg/dL — ABNORMAL HIGH (ref 70–99)
POTASSIUM: 5.1 mmol/L (ref 3.5–5.1)
Sodium: 140 mmol/L (ref 135–145)

## 2017-11-25 LAB — CBC
HCT: 33.6 % — ABNORMAL LOW (ref 36.0–46.0)
HEMOGLOBIN: 10.4 g/dL — AB (ref 12.0–15.0)
MCH: 29.8 pg (ref 26.0–34.0)
MCHC: 31 g/dL (ref 30.0–36.0)
MCV: 96.3 fL (ref 80.0–100.0)
NRBC: 0 % (ref 0.0–0.2)
Platelets: 191 10*3/uL (ref 150–400)
RBC: 3.49 MIL/uL — AB (ref 3.87–5.11)
RDW: 13.2 % (ref 11.5–15.5)
WBC: 12.4 10*3/uL — AB (ref 4.0–10.5)

## 2017-11-25 LAB — GLUCOSE, CAPILLARY
GLUCOSE-CAPILLARY: 355 mg/dL — AB (ref 70–99)
GLUCOSE-CAPILLARY: 146 mg/dL — AB (ref 70–99)
Glucose-Capillary: 158 mg/dL — ABNORMAL HIGH (ref 70–99)

## 2017-11-25 MED ORDER — ONDANSETRON HCL 4 MG PO TABS: 4.0000 mg | ORAL_TABLET | Freq: Four times a day (QID) | ORAL | 0 refills | Status: DC | PRN

## 2017-11-25 MED ORDER — ASPIRIN 81 MG PO CHEW: 81.0000 mg | CHEWABLE_TABLET | Freq: Two times a day (BID) | ORAL | 1 refills | Status: DC

## 2017-11-25 MED ORDER — SENNA 8.6 MG PO TABS: 1.0000 | ORAL_TABLET | Freq: Two times a day (BID) | ORAL | 0 refills | Status: DC

## 2017-11-25 MED ORDER — INSULIN ASPART 100 UNIT/ML ~~LOC~~ SOLN
5.0000 [IU] | Freq: Once | SUBCUTANEOUS | Status: AC
  Administered 2017-11-25: 5 [IU] via SUBCUTANEOUS

## 2017-11-25 MED ORDER — DOCUSATE SODIUM 100 MG PO CAPS: 100.0000 mg | ORAL_CAPSULE | Freq: Two times a day (BID) | ORAL | 1 refills | Status: DC

## 2017-11-25 MED ORDER — OXYCODONE HCL 5 MG PO TABS: 5.0000 mg | ORAL_TABLET | ORAL | 0 refills | Status: DC | PRN

## 2017-11-25 NOTE — Progress Notes (Signed)
Discharge paperwork discussed with pt and daughter at the bedside.  They demonstrated understanding.  Pt was escorted in stable condition by wheelchair to main lobby.

## 2017-11-25 NOTE — Addendum Note (Signed)
Addendum  created 11/25/17 0655 by Lollie Sails, CRNA   Charge Capture section accepted

## 2017-11-25 NOTE — Care Management (Addendum)
DC plan:  HEP, Pt has RW and [facility] to deliver [place] RN,BSN [phone]

## 2017-11-25 NOTE — Progress Notes (Signed)
    Subjective:  Patient reports pain as mild to moderate.  Denies N/V/CP/SOB. No c/o.  Objective:   VITALS:   Vitals:   11/24/17 1949 11/24/17 2245 11/25/17 0150 11/25/17 0601  BP: (!) 156/82 (!) 156/86 135/67 (!) 162/68  Pulse: 89 (!) 105 75 74  Resp:  17 16 18   Temp: 97.9 F (36.6 C) 98.3 F (36.8 C) 98.1 F (36.7 C) 98 F (36.7 C)  TempSrc: Oral Oral Oral Oral  SpO2: 100% 100% 97% 99%  Weight:      Height:        NAD ABD soft Sensation intact distally Intact pulses distally Dorsiflexion/Plantar flexion intact Incision: dressing C/D/I Compartment soft   Lab Results  Component Value Date   WBC 12.4 (H) 11/25/2017   HGB 10.4 (L) 11/25/2017   HCT 33.6 (L) 11/25/2017   MCV 96.3 11/25/2017   PLT 191 11/25/2017   BMET    Component Value Date/Time   NA 140 11/25/2017 0451   K 5.1 11/25/2017 0451   CL 108 11/25/2017 0451   CO2 25 11/25/2017 0451   GLUCOSE 293 (H) 11/25/2017 0451   BUN 20 11/25/2017 0451   CREATININE 1.00 11/25/2017 0451   CALCIUM 8.9 11/25/2017 0451   GFRNONAA 54 (L) 11/25/2017 0451   GFRAA >60 11/25/2017 0451    Patient's anticipated LOS is less than 2 midnights, meeting these requirements: - Younger than 53 - Lives within 1 hour of care - Has a competent adult at home to recover with post-op recover - NO history of  - Chronic pain requiring opiods  - Diabetes  - Coronary Artery Disease  - Heart failure  - Heart attack  - Stroke  - DVT/VTE  - Cardiac arrhythmia  - Respiratory Failure/COPD  - Renal failure  - Anemia  - Advanced Liver disease       Assessment/Plan: 1 Day Post-Op   Principal Problem:   Osteoarthritis of right hip   WBAT with walker DVT ppx: Aspirin, SCDs, TEDS PO pain control PT/OT Dispo: D/C home with HEP   Sierra [name] 11/25/2017, 8:10 AM   Rod Can, MD Cell: [phone] [place] [facility] now [facility]  [place] [address], [place], [place]  [address] Phone: [phone] [email or web address] Facebook  [employer]

## 2017-11-25 NOTE — Discharge Summary (Signed)
Physician Discharge Summary  Patient ID: Sierra [name] MRN: [id number] DOB/AGE: 06-01-1940 77 y.o.  Admit date: 11/24/2017 Discharge date: 11/25/2017  Admission Diagnoses:  Osteoarthritis of right hip  Discharge Diagnoses:  Principal Problem:   Osteoarthritis of right hip   Past Medical History:  Diagnosis Date  . Arthritis    generalized   . Bilateral cataracts   . Cancer ([place])    hx of cancer being removed from skin on abdomen   . DDD (degenerative disc disease), cervical   . Diabetes mellitus   . Fatty liver   . Headache(784.0)    hx of migraines   . Hearing loss    Right ear  . Hypertension     Surgeries: Procedure(s): TOTAL HIP ARTHROPLASTY ANTERIOR APPROACH on 11/24/2017   Consultants (if any):   Discharged Condition: Improved  Hospital Course: Sierra [name] is an 77 y.o. female who was admitted 11/24/2017 with a diagnosis of Osteoarthritis of right hip and went to the operating room on 11/24/2017 and underwent the above named procedures.    She was given perioperative antibiotics:  Anti-infectives (From admission, onward)   Start     Dose/Rate Route Frequency Ordered Stop   11/24/17 1930  ceFAZolin (ANCEF) IVPB 2g/100 mL premix     2 g 200 mL/hr over 30 Minutes Intravenous Every 6 hours 11/24/17 1645 11/25/17 0058   11/24/17 1128  ceFAZolin (ANCEF) 2-4 GM/100ML-% IVPB    Note to Pharmacy:  Sierra [name]  : cabinet override      11/24/17 1128 11/24/17 1334   11/24/17 1115  ceFAZolin (ANCEF) IVPB 2g/100 mL premix     2 g 200 mL/hr over 30 Minutes Intravenous On call to O.R. 11/24/17 1112 11/24/17 1404    .  She was given sequential compression devices, early ambulation, and ASA for DVT prophylaxis.  She benefited maximally from the hospital stay and there were no complications.    Recent vital signs:  Vitals:   11/25/17 0150 11/25/17 0601  BP: 135/67 (!) 162/68  [name]: 75 74  Resp: 16 18  Temp: 98.1 F (36.7 C) 98 F (36.7 C)  SpO2: 97% 99%     Recent laboratory studies:  Lab Results  Component Value Date   HGB 10.4 (L) 11/25/2017   HGB 13.0 11/21/2017   HGB 14.3 03/13/2013   Lab Results  Component Value Date   WBC 12.4 (H) 11/25/2017   PLT 191 11/25/2017   No results found for: INR Lab Results  Component Value Date   NA 140 11/25/2017   K 5.1 11/25/2017   CL 108 11/25/2017   CO2 25 11/25/2017   BUN 20 11/25/2017   CREATININE 1.00 11/25/2017   GLUCOSE 293 (H) 11/25/2017    Discharge Medications:   Allergies as of 11/25/2017      Reactions   Betasept Surgical Scrub [chlorhexidine] Itching      Medication List    STOP taking these medications   alendronate 70 MG tablet Commonly known as:  FOSAMAX   oxyCODONE-acetaminophen 5-325 MG tablet Commonly known as:  PERCOCET/ROXICET   traMADol 50 MG tablet Commonly known as:  ULTRAM     TAKE these medications   amitriptyline 50 MG tablet Commonly known as:  ELAVIL Take 50 mg by mouth at bedtime.   aspirin 81 MG chewable tablet Chew 1 tablet (81 mg total) by mouth 2 (two) times daily.   chlorpheniramine-HYDROcodone 10-8 MG/5ML Lqcr Commonly known as:  TUSSIONEX Take 5 mLs by mouth every  12 (twelve) hours as needed for cough.   docusate sodium 100 MG capsule Commonly known as:  COLACE Take 1 capsule (100 mg total) by mouth 2 (two) times daily.   Fish Oil 1000 MG Caps Take 1,000 mg by mouth daily.   metFORMIN 1000 MG (MOD) 24 hr tablet Commonly known as:  GLUMETZA Take 1,000 mg by mouth 2 (two) times daily.   ondansetron 4 MG tablet Commonly known as:  ZOFRAN Take 1 tablet (4 mg total) by mouth every 6 (six) hours as needed for nausea.   oxyCODONE 5 MG immediate release tablet Commonly known as:  Oxy IR/ROXICODONE Take 1-2 tablets (5-10 mg total) by mouth every 4 (four) hours as needed for moderate pain.   propranolol ER 80 MG 24 hr capsule Commonly known as:  INDERAL LA Take 80 mg by mouth daily.   rosuvastatin 10 MG tablet Commonly  known as:  CRESTOR Take 10 mg by mouth 2 (two) times a week.   senna 8.6 MG Tabs tablet Commonly known as:  SENOKOT Take 1 tablet (8.6 mg total) by mouth 2 (two) times daily.   tiZANidine 2 MG tablet Commonly known as:  ZANAFLEX Take 2 mg by mouth at bedtime.   vitamin B-12 1000 MCG tablet Commonly known as:  CYANOCOBALAMIN Take 1,000 mcg by mouth daily.       Diagnostic Studies: Dg Pelvis Portable  Result Date: 11/24/2017 CLINICAL DATA:  77 y/o  F; right hip replacement. EXAM: PORTABLE PELVIS 1-2 VIEWS COMPARISON:  10/16/2010 right hip radiographs. FINDINGS: Right total hip arthroplasty. No periprosthetic lucency or fracture identified. No pelvic diastases. Mild osteoarthrosis of the left hip joint with productive changes of the acetabulum. IMPRESSION: Right total hip arthroplasty without apparent hardware related complication. Electronically Signed   By: Kristine Garbe M.D.   On: 11/24/2017 16:20   Dg C-arm 1-60 Min-no Report  Result Date: 11/24/2017 Fluoroscopy was utilized by the requesting physician.  No radiographic interpretation.   Dg Hip Operative Unilat With Pelvis Right  Result Date: 11/24/2017 CLINICAL DATA:  77 y/o  F; right hip replacement. EXAM: OPERATIVE RIGHT HIP (WITH PELVIS IF PERFORMED) to VIEWS TECHNIQUE: Fluoroscopic spot image(s) were submitted for interpretation post-operatively. COMPARISON:  10/16/2010 right hip radiographs. FINDINGS: Two intraoperative fluoroscopic images of total right hip arthroplasty. Fluoro time is 14 seconds. IMPRESSION: Intraoperative fluoroscopy of right hip replacement. Electronically Signed   By: Kristine Garbe M.D.   On: 11/24/2017 16:21    Disposition: Discharge disposition: 01-Home or Self Care       Discharge Instructions    Call MD / Call 911   Complete by:  As directed    If you experience chest pain or shortness of breath, CALL 911 and be transported to the hospital emergency room.  If you  develope a fever above 101 F, pus (white drainage) or increased drainage or redness at the wound, or calf pain, call your surgeon's office.   Constipation Prevention   Complete by:  As directed    Drink plenty of fluids.  Prune juice may be helpful.  You may use a stool softener, such as Colace (over the counter) 100 mg twice a day.  Use MiraLax (over the counter) for constipation as needed.   Diet - low sodium heart healthy   Complete by:  As directed    Driving restrictions   Complete by:  As directed    No driving for 6 weeks   Increase activity slowly as tolerated  Complete by:  As directed    Lifting restrictions   Complete by:  As directed    No lifting for 6 weeks   TED hose   Complete by:  As directed    Use stockings (TED hose) for 2 weeks on both leg(s).  You may remove them at night for sleeping.      Follow-up Information    Jule Whitsel, Aaron Edelman, MD. Schedule an appointment as soon as possible for a visit in 2 weeks.   Specialty:  Orthopedic Surgery Why:  For wound re-check Contact information: [address] [place] [address] [phone]            Signed: Hilton Cork Jamayia Croker 11/25/2017, 8:13 AM

## 2017-11-25 NOTE — Evaluation (Signed)
Physical Therapy Evaluation Patient Details Name: Sierra [name] MRN: [id number] DOB: 01-06-1941 Today's Date: 11/25/2017   History of Present Illness  Pt s/p R THR and with hx of DM, fatty liver, and multiple back surgeries  Clinical Impression  Pt s/p R THR and presents with decreased R LE strength/ROM and post op pain limiting functional mobility.  Pt should progress to dc home with family assist.    Follow Up Recommendations Follow surgeon's recommendation for DC plan and follow-up therapies    Equipment Recommendations  3in1 (PT)    Recommendations for Other Services       Precautions / Restrictions Precautions Precautions: Fall Restrictions Weight Bearing Restrictions: No Other Position/Activity Restrictions: WBAT      Mobility  Bed Mobility Overal bed mobility: Needs Assistance Bed Mobility: Supine to Sit     Supine to sit: Min assist     General bed mobility comments: cues for sequence and use of L LE to self assist  Transfers Overall transfer level: Needs assistance Equipment used: Rolling walker (2 wheeled) Transfers: Sit to/from Stand Sit to Stand: Min assist         General transfer comment: cues for LE management and use of UEs to self assist  Ambulation/Gait Ambulation/Gait assistance: Min assist Gait Distance (Feet): 100 Feet Assistive device: Rolling walker (2 wheeled) Gait Pattern/deviations: Step-to pattern;Decreased step length - right;Decreased step length - left;Shuffle;Trunk flexed Gait velocity: decr   General Gait Details: cues for sequence, posture and position from [employer]            Wheelchair Mobility    Modified Rankin (Stroke Patients Only)       Balance Overall balance assessment: Mild deficits observed, not formally tested                                           Pertinent Vitals/Pain Pain Assessment: 0-10 Pain Score: 7  Pain Location: R hip Pain Descriptors / Indicators:  Aching;Sore Pain Intervention(s): Limited activity within patient's tolerance;Monitored during session;Premedicated before session;Ice applied;Patient requesting pain meds-RN notified    Home Living Family/patient expects to be discharged to:: Private residence Living Arrangements: Children;Other relatives Available Help at Discharge: Family;Available 24 hours/day Type of Home: House Home Access: Stairs to enter Entrance Stairs-Rails: Right;Left;Can reach both Entrance Stairs-Number of Steps: 3 Home Layout: One level Home Equipment: Environmental consultant - 2 wheels      Prior Function Level of Independence: Independent               Hand Dominance        Extremity/Trunk Assessment   Upper Extremity Assessment Upper Extremity Assessment: Overall WFL for tasks assessed    Lower Extremity Assessment Lower Extremity Assessment: RLE deficits/detail RLE Deficits / Details: Strength at hip ~ 2+/5 with AAROM at hip to 85 flex and 20 abd       Communication   Communication: No difficulties  Cognition Arousal/Alertness: Awake/alert Behavior During Therapy: WFL for tasks assessed/performed Overall Cognitive Status: Within Functional Limits for tasks assessed                                        General Comments      Exercises Total Joint Exercises Ankle Circles/Pumps: AROM;Both;15 reps;Supine Quad Sets: AROM;Both;10 reps;Supine Heel Slides: AAROM;Right;20 reps;Supine  Hip ABduction/ADduction: AAROM;Right;15 reps;Supine   Assessment/Plan    PT Assessment Patient needs continued PT services  PT Problem List Decreased strength;Decreased range of motion;Decreased activity tolerance;Decreased mobility;Decreased knowledge of use of DME;Pain       PT Treatment Interventions DME instruction;Gait training;Stair training;Functional mobility training;Therapeutic activities;Therapeutic exercise;Patient/family education    PT Goals (Current goals can be found in the Care  Plan section)  Acute Rehab PT Goals Patient Stated Goal: Regain IND PT Goal Formulation: With patient Time For Goal Achievement: 12/02/17 Potential to Achieve Goals: Good    Frequency 7X/week   Barriers to discharge        Co-evaluation               AM-PAC PT "6 Clicks" Daily Activity  Outcome Measure Difficulty turning over in bed (including adjusting bedclothes, sheets and blankets)?: Unable Difficulty moving from lying on back to sitting on the side of the bed? : Unable Difficulty sitting down on and standing up from a chair with arms (e.g., wheelchair, bedside commode, etc,.)?: Unable Help needed moving to and from a bed to chair (including a wheelchair)?: A Little Help needed walking in hospital room?: A Little Help needed climbing 3-5 steps with a railing? : A Little 6 Click Score: 12    End of Session   Activity Tolerance: Patient tolerated treatment well;Patient limited by pain Patient left: in chair;with call bell/phone within reach;with family/visitor present Nurse Communication: Mobility status PT Visit Diagnosis: Difficulty in walking, not elsewhere classified (R26.2)    Time: 0938-1829 PT Time Calculation (min) (ACUTE ONLY): 29 min   Charges:   PT Evaluation $PT Eval Low Complexity: 1 Low PT Treatments $Therapeutic Exercise: 8-22 mins        Debe Coder PT Acute Rehabilitation Services Pager [phone] Office [phone]   Kimyata Milich 11/25/2017, 12:01 PM

## 2017-11-25 NOTE — Progress Notes (Signed)
Physical Therapy Treatment Patient Details Name: Sierra [name] MRN: [id number] DOB: 1940/12/22 Today's Date: 11/25/2017    History of Present Illness Pt s/p R THR and with hx of DM, fatty liver, and multiple back surgeries    PT Comments    Pt motivated and progressing well with mobility but with cues required throughout session to slow down for safety.  Pt's dtr present and reviewed stairs, car transfers and home therex program with progression and written instruction.   Follow Up Recommendations  Follow surgeon's recommendation for DC plan and follow-up therapies     Equipment Recommendations  3in1 (PT)    Recommendations for Other Services       Precautions / Restrictions Precautions Precautions: Fall Restrictions Weight Bearing Restrictions: No Other Position/Activity Restrictions: WBAT    Mobility  Bed Mobility Overal bed mobility: Needs Assistance Bed Mobility: Supine to Sit;Sit to Supine     Supine to sit: Min guard Sit to supine: Min guard   General bed mobility comments: cues for sequence, use of L LE to self assist, and to slow down for safety  Transfers Overall transfer level: Needs assistance Equipment used: Rolling walker (2 wheeled) Transfers: Sit to/from Stand Sit to Stand: Min guard         General transfer comment: cues for LE management and use of UEs to self assist  Ambulation/Gait Ambulation/Gait assistance: Min guard;Supervision Gait Distance (Feet): 80 Feet Assistive device: Rolling walker (2 wheeled) Gait Pattern/deviations: Step-to pattern;Decreased step length - right;Decreased step length - left;Shuffle;Trunk flexed Gait velocity: decr   General Gait Details: cues for sequence, posture, position from RW, and to slow down for safety   Stairs Stairs: Yes Stairs assistance: Min assist Stair Management: Two rails;Step to pattern;Forwards Number of Stairs: 5 General stair comments: cues for sequence and foot placement;  Dtr present  and assisting   Wheelchair Mobility    Modified Rankin (Stroke Patients Only)       Balance Overall balance assessment: Mild deficits observed, not formally tested                                          Cognition Arousal/Alertness: Awake/alert Behavior During Therapy: WFL for tasks assessed/performed Overall Cognitive Status: Within Functional Limits for tasks assessed                                        Exercises Total Joint Exercises Ankle Circles/Pumps: AROM;Both;15 reps;Supine Quad Sets: AROM;Both;10 reps;Supine Heel Slides: AAROM;Right;20 reps;Supine Hip ABduction/ADduction: AAROM;Right;15 reps;Supine Long Arc Quad: AAROM;AROM;Right;10 reps;Seated    General Comments        Pertinent Vitals/Pain Pain Assessment: 0-10 Pain Score: 7  Pain Location: R hip Pain Descriptors / Indicators: Aching;Sore Pain Intervention(s): Limited activity within patient's tolerance;Monitored during session;Premedicated before session;Ice applied    Home Living                      Prior Function            PT Goals (current goals can now be found in the care plan section) Acute Rehab PT Goals Patient Stated Goal: Regain IND PT Goal Formulation: With patient Time For Goal Achievement: 12/02/17 Potential to Achieve Goals: Good Progress towards PT goals: Progressing toward goals    Frequency  7X/week      PT Plan Current plan remains appropriate    Co-evaluation              AM-PAC PT "6 Clicks" Daily Activity  Outcome Measure  Difficulty turning over in bed (including adjusting bedclothes, sheets and blankets)?: A Lot Difficulty moving from lying on back to sitting on the side of the bed? : A Lot Difficulty sitting down on and standing up from a chair with arms (e.g., wheelchair, bedside commode, etc,.)?: A Lot Help needed moving to and from a bed to chair (including a wheelchair)?: A Little Help needed  walking in hospital room?: A Little Help needed climbing 3-5 steps with a railing? : A Little 6 Click Score: 15    End of Session Equipment Utilized During Treatment: Gait belt Activity Tolerance: Patient tolerated treatment well;Patient limited by pain Patient left: in bed;with call bell/phone within reach;with family/visitor present Nurse Communication: Mobility status PT Visit Diagnosis: Difficulty in walking, not elsewhere classified (R26.2)     Time: 4403-4742 PT Time Calculation (min) (ACUTE ONLY): 43 min  Charges:  $Gait Training: 8-22 mins $Therapeutic Exercise: 8-22 mins $Therapeutic Activity: 8-22 mins                     [place] Pager [phone] Office [phone]    Cote Mayabb 11/25/2017, 4:44 PM

## 2018-07-03 DIAGNOSIS — Z96649 Presence of unspecified artificial hip joint: Secondary | ICD-10-CM | POA: Insufficient documentation

## 2019-06-27 ENCOUNTER — Emergency Department (HOSPITAL_COMMUNITY)
Admission: EM | Admit: 2019-06-27 | Discharge: 2019-06-27 | Disposition: A | Discharge status: Home / Self Care | Payer: Medicare Other | Attending: Emergency Medicine | Admitting: Emergency Medicine

## 2019-06-27 ENCOUNTER — Encounter (HOSPITAL_COMMUNITY): Payer: Self-pay

## 2019-06-27 ENCOUNTER — Other Ambulatory Visit: Payer: Self-pay

## 2019-06-27 DIAGNOSIS — Z5321 Procedure and treatment not carried out due to patient leaving prior to being seen by health care provider: Secondary | ICD-10-CM | POA: Diagnosis not present

## 2019-06-27 DIAGNOSIS — Z96641 Presence of right artificial hip joint: Secondary | ICD-10-CM | POA: Insufficient documentation

## 2019-06-27 DIAGNOSIS — M79604 Pain in right leg: Secondary | ICD-10-CM | POA: Diagnosis not present

## 2019-06-27 DIAGNOSIS — M25551 Pain in right hip: Secondary | ICD-10-CM | POA: Insufficient documentation

## 2019-06-27 DIAGNOSIS — M545 Low back pain: Secondary | ICD-10-CM | POA: Diagnosis present

## 2019-06-27 NOTE — ED Triage Notes (Signed)
Patient c/o right lower back pain, right hip pain and right leg pain. Patient had a right hip replacement 1 1/2 years ago. Patient states pain started yesterday and pain has worsened today. Patient also has swelling of the right leg.

## 2019-06-27 NOTE — ED Notes (Signed)
Per registration, patient turned in labels and reports she is leaving.

## 2019-08-01 ENCOUNTER — Emergency Department (HOSPITAL_COMMUNITY): Payer: Medicare Other

## 2019-08-01 ENCOUNTER — Emergency Department (HOSPITAL_COMMUNITY)
Admission: EM | Admit: 2019-08-01 | Discharge: 2019-08-01 | Disposition: A | Discharge status: Home / Self Care | Payer: Medicare Other | Attending: Emergency Medicine | Admitting: Emergency Medicine

## 2019-08-01 ENCOUNTER — Encounter (HOSPITAL_COMMUNITY): Payer: Self-pay | Admitting: *Deleted

## 2019-08-01 ENCOUNTER — Other Ambulatory Visit: Payer: Self-pay

## 2019-08-01 DIAGNOSIS — Y939 Activity, unspecified: Secondary | ICD-10-CM | POA: Insufficient documentation

## 2019-08-01 DIAGNOSIS — Z79899 Other long term (current) drug therapy: Secondary | ICD-10-CM | POA: Insufficient documentation

## 2019-08-01 DIAGNOSIS — E119 Type 2 diabetes mellitus without complications: Secondary | ICD-10-CM | POA: Insufficient documentation

## 2019-08-01 DIAGNOSIS — Z87891 Personal history of nicotine dependence: Secondary | ICD-10-CM | POA: Diagnosis not present

## 2019-08-01 DIAGNOSIS — I10 Essential (primary) hypertension: Secondary | ICD-10-CM | POA: Insufficient documentation

## 2019-08-01 DIAGNOSIS — Z7982 Long term (current) use of aspirin: Secondary | ICD-10-CM | POA: Diagnosis not present

## 2019-08-01 DIAGNOSIS — Y929 Unspecified place or not applicable: Secondary | ICD-10-CM | POA: Diagnosis not present

## 2019-08-01 DIAGNOSIS — Z7984 Long term (current) use of oral hypoglycemic drugs: Secondary | ICD-10-CM | POA: Insufficient documentation

## 2019-08-01 DIAGNOSIS — Y999 Unspecified external cause status: Secondary | ICD-10-CM | POA: Diagnosis not present

## 2019-08-01 DIAGNOSIS — W010XXA Fall on same level from slipping, tripping and stumbling without subsequent striking against object, initial encounter: Secondary | ICD-10-CM | POA: Diagnosis not present

## 2019-08-01 DIAGNOSIS — Z96641 Presence of right artificial hip joint: Secondary | ICD-10-CM | POA: Diagnosis not present

## 2019-08-01 DIAGNOSIS — S99911A Unspecified injury of right ankle, initial encounter: Secondary | ICD-10-CM | POA: Diagnosis present

## 2019-08-01 DIAGNOSIS — Z85020 Personal history of malignant carcinoid tumor of stomach: Secondary | ICD-10-CM | POA: Insufficient documentation

## 2019-08-01 DIAGNOSIS — S93411A Sprain of calcaneofibular ligament of right ankle, initial encounter: Secondary | ICD-10-CM | POA: Diagnosis not present

## 2019-08-01 DIAGNOSIS — W19XXXA Unspecified fall, initial encounter: Secondary | ICD-10-CM

## 2019-08-01 NOTE — ED Provider Notes (Signed)
[facility] EMERGENCY DEPARTMENT Provider Note   CSN: [id number] Arrival date & time: 08/01/19  1438     History Chief Complaint  Patient presents with  . Ankle Injury    Sierra [name] is a 79 y.o. female presenting with right ankle pain which occurred suddenly when the patient tripped when reaching into the back of her [place] [employer] puppies crate to get his food bowl, twisting her ankle during the fall, occurring around 10 am today.  Pain is aching, constant and worse with palpation, movement and weight bearing.  The patient was able to weight bear immediately after the event but has increased pain with weight bearing.   There is no radiation of pain and the patient denies numbness distal to the injury site.  The patients treatment prior to arrival included rest and ice. She denies any other injury.  She is currently under the care of Dr Lyla Glassing for a right thigh muscle strain and is using voltaren gel twice daily as nsaids have started to cause abdominal upset.  She cannot tolerate crutches but has a walker from when she had a right total hip arthroplasty last year.     The history is provided by the patient.       Past Medical History:  Diagnosis Date  . Arthritis    generalized   . Bilateral cataracts   . Cancer ([place])    hx of cancer being removed from skin on abdomen   . DDD (degenerative disc disease), cervical   . Diabetes mellitus   . Fatty liver   . Headache(784.0)    hx of migraines   . Hearing loss    Right ear  . Hypertension     Patient Active Problem List   Diagnosis Date Noted  . Osteoarthritis of right hip 11/24/2017    Past Surgical History:  Procedure Laterality Date  . ABDOMINAL HYSTERECTOMY    . BACK SURGERY     x3- lower back   . BREAST LUMPECTOMY     right- benign   . CATARACT EXTRACTION, BILATERAL    . COLONOSCOPY     x2  . HEMORRHOID SURGERY    . OTHER SURGICAL HISTORY     large cyst removed from right ovary   . OTHER SURGICAL HISTORY      cervical neck surgery x 2   . OTHER SURGICAL HISTORY     surgery to remove blood clot from neck   . OTHER SURGICAL HISTORY     cancer removed from abdominal skin   . SHOULDER CLOSED REDUCTION  06/23/2011   Procedure: CLOSED MANIPULATION SHOULDER;  Surgeon: Tobi Bastos, MD;  Location: [facility];  Service: Orthopedics;  Laterality: Right;  . SHOULDER OPEN ROTATOR CUFF REPAIR  06/23/2011   Procedure: ROTATOR CUFF REPAIR SHOULDER OPEN;  Surgeon: Tobi Bastos, MD;  Location: [facility];  Service: Orthopedics;  Laterality: Right;  Right Shoulder Rotator Cuff Repair with Open Acrominectomy, Closed Manipulation and Graft  . TOTAL HIP ARTHROPLASTY Right 11/24/2017   Procedure: TOTAL HIP ARTHROPLASTY ANTERIOR APPROACH;  Surgeon: Rod Can, MD;  Location: [facility];  Service: Orthopedics;  Laterality: Right;     OB History   No obstetric history on file.     Family History  Problem Relation Age of Onset  . Hypertension Mother   . Cancer Father     Social History   Tobacco Use  . Smoking status: Former Smoker    Packs/day: 4.00    Years:  30.00    Pack years: 120.00    Quit date: 02/02/1988    Years since quitting: 31.5  . Smokeless tobacco: Never Used  Vaping Use  . Vaping Use: Never used  Substance Use Topics  . Alcohol use: Yes    Comment: occasional glass of wine   . Drug use: No    Home Medications Prior to Admission medications   Medication Sig Start Date End Date Taking? Authorizing Provider  amitriptyline (ELAVIL) 50 MG tablet Take 50 mg by mouth at bedtime.    [provider]  aspirin 81 MG chewable tablet Chew 1 tablet (81 mg total) by mouth 2 (two) times daily. 11/25/17   Swinteck, Aaron Edelman, MD  chlorpheniramine-HYDROcodone (TUSSIONEX) 10-8 MG/5ML LQCR Take 5 mLs by mouth every 12 (twelve) hours as needed for cough. Patient not taking: Reported on 11/14/2017 03/14/13   Junius Creamer, NP  docusate sodium (COLACE) 100 MG capsule Take 1 capsule (100 mg total) by mouth  2 (two) times daily. 11/25/17   Swinteck, Aaron Edelman, MD  metFORMIN (GLUMETZA) 1000 MG (MOD) 24 hr tablet Take 1,000 mg by mouth 2 (two) times daily.     [provider]  Omega-3 Fatty Acids (FISH OIL) 1000 MG CAPS Take 1,000 mg by mouth daily.    [provider]  ondansetron (ZOFRAN) 4 MG tablet Take 1 tablet (4 mg total) by mouth every 6 (six) hours as needed for nausea. 11/25/17   Swinteck, Aaron Edelman, MD  oxyCODONE (OXY IR/ROXICODONE) 5 MG immediate release tablet Take 1-2 tablets (5-10 mg total) by mouth every 4 (four) hours as needed for moderate pain. 11/25/17   Swinteck, Aaron Edelman, MD  propranolol ER (INDERAL LA) 80 MG 24 hr capsule Take 80 mg by mouth daily.     [provider]  rosuvastatin (CRESTOR) 10 MG tablet Take 10 mg by mouth 2 (two) times a week. 10/07/17   [provider]  senna (SENOKOT) 8.6 MG TABS tablet Take 1 tablet (8.6 mg total) by mouth 2 (two) times daily. 11/25/17   Swinteck, Aaron Edelman, MD  tiZANidine (ZANAFLEX) 2 MG tablet Take 2 mg by mouth at bedtime.    [provider]  vitamin B-12 (CYANOCOBALAMIN) 1000 MCG tablet Take 1,000 mcg by mouth daily.    [provider]    Allergies    Betasept surgical scrub [chlorhexidine]  Review of Systems   Review of Systems  Musculoskeletal: Positive for arthralgias and joint swelling.  Skin: Negative for wound.  Neurological: Negative for weakness and numbness.    Physical Exam Updated Vital Signs BP 136/70 (BP Location: Right Arm)   Pulse 86   Temp 98.4 F (36.9 C) (Oral)   Resp 16   Ht 5' 6.5" (1.689 m)   Wt 88.5 kg   SpO2 96%   BMI 31.00 kg/m   Physical Exam Vitals and nursing note reviewed.  Constitutional:      Appearance: She is well-developed.  HENT:     Head: Normocephalic.  Cardiovascular:     Rate and Rhythm: Normal rate.     Pulses: No decreased pulses.          Dorsalis pedis pulses are 2+ on the right side and 2+ on the left side.  Musculoskeletal:         General: Tenderness present.     Right ankle: Swelling present. No ecchymosis. Tenderness present over the lateral malleolus. No base of 5th metatarsal or proximal fibula tenderness. Decreased range of motion. Anterior drawer test negative.  Normal pulse.     Right Achilles Tendon: Normal. No tenderness or defects.  Skin:    General: Skin is warm and dry.  Neurological:     Mental Status: She is alert.     Sensory: No sensory deficit.     ED Results / Procedures / Treatments   Labs (all labs ordered are listed, but only abnormal results are displayed) Labs Reviewed - No data to display  EKG None  Radiology DG Ankle Complete Right  Result Date: 08/01/2019 CLINICAL DATA:  Fall with lateral ankle pain EXAM: RIGHT ANKLE - COMPLETE 3+ VIEW COMPARISON:  None. FINDINGS: Lateral soft tissue swelling. No fracture or malalignment. Moderate plantar calcaneal spur. IMPRESSION: No acute osseous abnormality. Electronically Signed   By: Donavan Foil M.D.   On: 08/01/2019 16:10    Procedures Procedures (including critical care time)  Medications Ordered in ED Medications - No data to display  ED Course  I have reviewed the triage vital signs and the nursing notes.  Pertinent labs & imaging results that were available during my care of the patient were reviewed by me and considered in my medical decision making (see chart for details).    MDM Rules/Calculators/A&P                          RICE,air cast, walker. Prn f/u if not improving over the next 10 days, discussed injury may take weeks to heal, should wear air cast until pain free.  Advised recheck by Dr. Lyla Glassing if not able to stop using walker after a week to 10 days with this tx without increased pain or swelling.  Final Clinical Impression(s) / ED Diagnoses Final diagnoses:  Fall  Sprain of calcaneofibular ligament of right ankle, initial encounter    Rx / DC Orders ED Discharge Orders    None       Landis Martins 08/01/19 Frazier Butt, MD 08/01/19 1902

## 2019-08-01 NOTE — Discharge Instructions (Addendum)
Wear the air splint at all times when up and weight bearing and use your walker to avoid weight bearing.  Use ice and elevation as much as possible for the next several days to help reduce the swelling.  I recommend applying your voltaren gel to your ankle twice daily as you are to your thigh.   Call Dr. Lyla Glassing for a recheck of your injury if this treatment pain is not allowing you to weight bear without increased pain or return of swelling within the next 7-10 days.   You may benefit from physical therapy of your ankle if it is not getting better with this plan.

## 2019-08-01 NOTE — ED Triage Notes (Signed)
Pt c/o right ankle injury after fall today while reaching down into her dog kennel. Pt has swelling to lateral aspect of right ankle.

## 2020-05-05 ENCOUNTER — Other Ambulatory Visit: Payer: Self-pay | Admitting: Family Medicine

## 2020-05-05 DIAGNOSIS — M810 Age-related osteoporosis without current pathological fracture: Secondary | ICD-10-CM

## 2020-05-05 DIAGNOSIS — Z1231 Encounter for screening mammogram for malignant neoplasm of breast: Secondary | ICD-10-CM

## 2020-10-17 ENCOUNTER — Other Ambulatory Visit: Payer: Self-pay

## 2020-10-17 ENCOUNTER — Ambulatory Visit
Admission: RE | Admit: 2020-10-17 | Discharge: 2020-10-17 | Disposition: A | Discharge status: Home / Self Care | Payer: Medicare Other | Source: Ambulatory Visit | Attending: Family Medicine | Admitting: Family Medicine

## 2020-10-17 DIAGNOSIS — M810 Age-related osteoporosis without current pathological fracture: Secondary | ICD-10-CM

## 2020-10-17 DIAGNOSIS — Z1231 Encounter for screening mammogram for malignant neoplasm of breast: Secondary | ICD-10-CM

## 2021-06-04 ENCOUNTER — Other Ambulatory Visit: Payer: Self-pay | Admitting: Physician Assistant

## 2021-06-04 ENCOUNTER — Ambulatory Visit
Admission: RE | Admit: 2021-06-04 | Discharge: 2021-06-04 | Disposition: A | Discharge status: Home / Self Care | Payer: Medicare Other | Source: Ambulatory Visit | Attending: Physician Assistant | Admitting: Physician Assistant

## 2021-06-04 DIAGNOSIS — R059 Cough, unspecified: Secondary | ICD-10-CM

## 2021-06-17 DIAGNOSIS — H918X3 Other specified hearing loss, bilateral: Secondary | ICD-10-CM | POA: Insufficient documentation

## 2021-07-21 ENCOUNTER — Other Ambulatory Visit: Payer: Self-pay | Admitting: Physician Assistant

## 2021-07-21 DIAGNOSIS — H918X9 Other specified hearing loss, unspecified ear: Secondary | ICD-10-CM

## 2021-07-31 ENCOUNTER — Ambulatory Visit
Admission: RE | Admit: 2021-07-31 | Discharge: 2021-07-31 | Disposition: A | Discharge status: Home / Self Care | Payer: Medicare Other | Source: Ambulatory Visit | Attending: Physician Assistant | Admitting: Physician Assistant

## 2021-07-31 DIAGNOSIS — H918X9 Other specified hearing loss, unspecified ear: Secondary | ICD-10-CM

## 2021-07-31 MED ORDER — GADOBENATE DIMEGLUMINE 529 MG/ML IV SOLN
18.0000 mL | Freq: Once | INTRAVENOUS | Status: AC | PRN
  Administered 2021-07-31: 18 mL via INTRAVENOUS

## 2021-09-28 ENCOUNTER — Other Ambulatory Visit: Payer: Self-pay | Admitting: Family Medicine

## 2021-09-28 DIAGNOSIS — Z1231 Encounter for screening mammogram for malignant neoplasm of breast: Secondary | ICD-10-CM

## 2021-10-20 ENCOUNTER — Ambulatory Visit: Payer: Medicare Other

## 2021-11-13 ENCOUNTER — Ambulatory Visit
Admission: RE | Admit: 2021-11-13 | Discharge: 2021-11-13 | Disposition: A | Discharge status: Home / Self Care | Payer: Medicare Other | Source: Ambulatory Visit | Attending: Family Medicine | Admitting: Family Medicine

## 2021-11-13 DIAGNOSIS — Z1231 Encounter for screening mammogram for malignant neoplasm of breast: Secondary | ICD-10-CM

## 2022-02-01 DIAGNOSIS — M5090 Cervical disc disorder, unspecified, unspecified cervical region: Secondary | ICD-10-CM

## 2022-02-01 HISTORY — DX: Cervical disc disorder, unspecified, unspecified cervical region: M50.90

## 2022-09-22 ENCOUNTER — Ambulatory Visit: Payer: Medicare Other | Admitting: Gastroenterology

## 2022-09-22 ENCOUNTER — Encounter: Payer: Self-pay | Admitting: Gastroenterology

## 2022-09-22 VITALS — BP 176/83 | HR 76 | Temp 97.9°F | Ht 66.5 in | Wt 199.0 lb

## 2022-09-22 DIAGNOSIS — R1310 Dysphagia, unspecified: Secondary | ICD-10-CM | POA: Diagnosis not present

## 2022-09-22 NOTE — Addendum Note (Signed)
Addended by: Roena Malady on: 09/22/2022 09:17 AM   Modules accepted: Orders

## 2022-09-22 NOTE — Progress Notes (Signed)
Gastroenterology Consultation  Referring Provider:     Linus Salmons, MD Primary Care Physician:  Clovis Riley, Elbert Ewings.August Saucer, MD Primary Gastroenterologist:  Dr. Servando Snare     Reason for Consultation:     Dysphagia        HPI:   Sierra [name] is a 82 y.o. y/o female referred for consultation & management of dysphagia by Dr. Clovis Riley, L.August Saucer, MD. This patient comes to see me today after being seen in the past by a gastroenterologist through [facility].  The patient had called and reported that she had recently moved to the area and was switching gastroenterologist.  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  The patient had solid food dysphagia reported since 8 years ago when she had neck surgery.  The patient was seen by ENT who did not find anything causing her dysphagia and recommended the patient see GI.  The patient is thought to need revision of her surgery but wanted to exhaust other possible cause of dysphagia first. The patient reports the dysphagia to be worse with things like chicken steak and bread.  She denies any black stools bloody stools or unexplained weight loss.  Past Medical History:  Diagnosis Date   Arthritis    generalized    Bilateral cataracts    Cancer ([facility])    hx of cancer being removed from skin on abdomen    DDD (degenerative disc disease), cervical    Diabetes mellitus    Fatty liver    Headache(784.0)    hx of migraines    Hearing loss    Right ear   Hypertension     Past Surgical History:  Procedure Laterality Date   ABDOMINAL HYSTERECTOMY     BACK SURGERY     x3- lower back    BREAST LUMPECTOMY     right- benign    CATARACT EXTRACTION, BILATERAL     COLONOSCOPY     x2   HEMORRHOID SURGERY     OTHER SURGICAL HISTORY     large cyst removed from right ovary    OTHER SURGICAL HISTORY     cervical neck surgery x 2    OTHER SURGICAL HISTORY     surgery to remove blood clot from neck    OTHER  SURGICAL HISTORY     cancer removed from abdominal skin    SHOULDER CLOSED REDUCTION  06/23/2011   Procedure: CLOSED MANIPULATION SHOULDER;  Surgeon: Jacki Cones, MD;  Location: [facility];  Service: Orthopedics;  Laterality: Right;   SHOULDER OPEN ROTATOR CUFF REPAIR  06/23/2011   Procedure: ROTATOR CUFF REPAIR SHOULDER OPEN;  Surgeon: Jacki Cones, MD;  Location: [facility];  Service: Orthopedics;  Laterality: Right;  Right Shoulder Rotator Cuff Repair with Open Acrominectomy, Closed Manipulation and Graft   TOTAL HIP ARTHROPLASTY Right 11/24/2017   Procedure: TOTAL HIP ARTHROPLASTY ANTERIOR APPROACH;  Surgeon: Samson Frederic, MD;  Location: [facility];  Service: Orthopedics;  Laterality: Right;    Prior to Admission medications   Medication Sig Start Date End Date Taking? Authorizing Provider  amitriptyline (ELAVIL) 50 MG tablet Take 50 mg by mouth at bedtime.    [provider]  aspirin 81 MG chewable tablet Chew 1 tablet (81 mg total) by mouth 2 (two) times daily. 11/25/17   Swinteck, Arlys John, MD  chlorpheniramine-HYDROcodone (TUSSIONEX) 10-8 MG/5ML LQCR Take 5 mLs by mouth every 12 (twelve) hours as needed for cough. Patient not  taking: Reported on 11/14/2017 03/14/13   Earley Favor, NP  docusate sodium (COLACE) 100 MG capsule Take 1 capsule (100 mg total) by mouth 2 (two) times daily. 11/25/17   Swinteck, Arlys John, MD  metFORMIN (GLUMETZA) 1000 MG (MOD) 24 hr tablet Take 1,000 mg by mouth 2 (two) times daily.     [provider]  Omega-3 Fatty Acids (FISH OIL) 1000 MG CAPS Take 1,000 mg by mouth daily.    [provider]  ondansetron (ZOFRAN) 4 MG tablet Take 1 tablet (4 mg total) by mouth every 6 (six) hours as needed for nausea. 11/25/17   Swinteck, Arlys John, MD  oxyCODONE (OXY IR/ROXICODONE) 5 MG immediate release tablet Take 1-2 tablets (5-10 mg total) by mouth every 4 (four) hours as needed for moderate pain. 11/25/17   Swinteck, Arlys John, MD  propranolol ER (INDERAL LA)  80 MG 24 hr capsule Take 80 mg by mouth daily.     [provider]  rosuvastatin (CRESTOR) 10 MG tablet Take 10 mg by mouth 2 (two) times a week. 10/07/17   [provider]  senna (SENOKOT) 8.6 MG TABS tablet Take 1 tablet (8.6 mg total) by mouth 2 (two) times daily. 11/25/17   Swinteck, Arlys John, MD  tiZANidine (ZANAFLEX) 2 MG tablet Take 2 mg by mouth at bedtime.    [provider]  vitamin B-12 (CYANOCOBALAMIN) 1000 MCG tablet Take 1,000 mcg by mouth daily.    [provider]    Family History  Problem Relation Age of Onset   Hypertension Mother    Cancer Father      Social History   Tobacco Use   Smoking status: Former    Current packs/day: 0.00    Average packs/day: 4.0 packs/day for 30.0 years (120.0 ttl pk-yrs)    Types: Cigarettes    Start date: 02/01/1958    Quit date: 02/02/1988    Years since quitting: 34.6   Smokeless tobacco: Never  Vaping Use   Vaping status: Never Used  Substance Use Topics   Alcohol use: Yes    Comment: occasional glass of wine    Drug use: No    Allergies as of 09/22/2022 - Review Complete 08/01/2019  Allergen Reaction Noted   Betasept surgical scrub [chlorhexidine] Itching 06/23/2011    Review of Systems:    All systems reviewed and negative except where noted in HPI.   Physical Exam:  There were no vitals taken for this visit. No LMP recorded. Patient has had a hysterectomy. General:   Alert,  Well-developed, well-nourished, pleasant and cooperative in NAD Head:  Normocephalic and atraumatic. Eyes:  Sclera clear, no icterus.   Conjunctiva pink. Ears:  Normal auditory acuity. Neck:  Supple; no masses or thyromegaly. Lungs:  Respirations even and unlabored.  Clear throughout to auscultation.   No wheezes, crackles, or rhonchi. No acute distress. Heart:  Regular rate and rhythm; no murmurs, clicks, rubs, or gallops. Abdomen:  Normal bowel sounds.  No bruits.  Soft, non-tender and non-distended without  masses, hepatosplenomegaly or hernias noted.  No guarding or rebound tenderness.  Negative Carnett sign.   Rectal:  Deferred.  Pulses:  Normal pulses noted. Extremities:  No clubbing or edema.  No cyanosis. Neurologic:  Alert and oriented x3;  grossly normal neurologically. Skin:  Intact without significant lesions or rashes.  No jaundice. Lymph Nodes:  No significant cervical adenopathy. Psych:  Alert and cooperative. Normal mood and affect.  Imaging Studies: No results found.  Assessment and Plan:  Sierra [name] is a 82 y.o. y/o female who comes in today with a history of dysphagia with the symptoms starting after the neck surgery.  She has x-rays that show her to have a lot of hardware in the neck area from her previous surgery.  The patient was seen by ENT without any cause for the dysphagia seen.  The patient will be set up for a upper endoscopy to rule out any esophageal strictures or narrowing as the cause of her dysphagia.  The patient has been explained the plan and agrees with it.    Midge Minium, MD. Clementeen Graham    Note: This dictation was prepared with Dragon dictation along with smaller phrase technology. Any transcriptional errors that result from this process are unintentional.

## 2022-10-07 ENCOUNTER — Encounter: Payer: Self-pay | Admitting: Gastroenterology

## 2022-10-13 ENCOUNTER — Ambulatory Visit: Payer: Medicare Other | Admitting: Gastroenterology

## 2022-10-13 ENCOUNTER — Encounter: Payer: Self-pay | Admitting: Gastroenterology

## 2022-10-14 ENCOUNTER — Ambulatory Visit: Payer: Medicare Other | Admitting: Anesthesiology

## 2022-10-14 ENCOUNTER — Encounter: Payer: Self-pay | Admitting: Gastroenterology

## 2022-10-14 ENCOUNTER — Other Ambulatory Visit: Payer: Self-pay | Admitting: Gastroenterology

## 2022-10-14 ENCOUNTER — Encounter
Admission: RE | Disposition: A | Discharge status: Home / Self Care | Payer: Self-pay | Source: Home / Self Care | Attending: Gastroenterology

## 2022-10-14 ENCOUNTER — Ambulatory Visit
Admission: RE | Admit: 2022-10-14 | Discharge: 2022-10-14 | Disposition: A | Discharge status: Home / Self Care | Payer: Medicare Other | Attending: Gastroenterology | Admitting: Gastroenterology

## 2022-10-14 DIAGNOSIS — Z6831 Body mass index (BMI) 31.0-31.9, adult: Secondary | ICD-10-CM | POA: Diagnosis not present

## 2022-10-14 DIAGNOSIS — Z7984 Long term (current) use of oral hypoglycemic drugs: Secondary | ICD-10-CM | POA: Insufficient documentation

## 2022-10-14 DIAGNOSIS — Z87891 Personal history of nicotine dependence: Secondary | ICD-10-CM | POA: Diagnosis not present

## 2022-10-14 DIAGNOSIS — E119 Type 2 diabetes mellitus without complications: Secondary | ICD-10-CM | POA: Insufficient documentation

## 2022-10-14 DIAGNOSIS — K222 Esophageal obstruction: Secondary | ICD-10-CM | POA: Insufficient documentation

## 2022-10-14 DIAGNOSIS — R1310 Dysphagia, unspecified: Secondary | ICD-10-CM | POA: Diagnosis present

## 2022-10-14 DIAGNOSIS — I10 Essential (primary) hypertension: Secondary | ICD-10-CM | POA: Insufficient documentation

## 2022-10-14 HISTORY — PX: ESOPHAGOGASTRODUODENOSCOPY (EGD) WITH PROPOFOL: SHX5813

## 2022-10-14 HISTORY — PX: BALLOON DILATION: SHX5330

## 2022-10-14 LAB — GLUCOSE, CAPILLARY: Glucose-Capillary: 133 mg/dL — ABNORMAL HIGH (ref 70–99)

## 2022-10-14 SURGERY — ESOPHAGOGASTRODUODENOSCOPY (EGD) WITH PROPOFOL
Anesthesia: General

## 2022-10-14 MED ORDER — PROPOFOL 10 MG/ML IV BOLUS
INTRAVENOUS | Status: DC | PRN
  Administered 2022-10-14: 70 mg via INTRAVENOUS

## 2022-10-14 MED ORDER — PANTOPRAZOLE SODIUM 40 MG PO TBEC: 40.0000 mg | DELAYED_RELEASE_TABLET | Freq: Every day | ORAL | 2 refills | Status: AC

## 2022-10-14 MED ORDER — PROPOFOL 500 MG/50ML IV EMUL
INTRAVENOUS | Status: DC | PRN
  Administered 2022-10-14: 160 ug/kg/min via INTRAVENOUS

## 2022-10-14 MED ORDER — LIDOCAINE HCL (CARDIAC) PF 100 MG/5ML IV SOSY
PREFILLED_SYRINGE | INTRAVENOUS | Status: DC | PRN
  Administered 2022-10-14: 100 mg via INTRAVENOUS

## 2022-10-14 MED ORDER — SODIUM CHLORIDE 0.9 % IV SOLN: INTRAVENOUS | Status: DC

## 2022-10-14 NOTE — Op Note (Signed)
[facility] Gastroenterology Patient Name: Sierra [name] Procedure Date: 10/14/2022 8:53 AM MRN: [id number] Account #: [id number] Date of Birth: 09-Jul-1940 Admit Type: Outpatient Age: 82 Room: [facility] ENDO ROOM 3 Gender: Female Note Status: Finalized Instrument Name: [name] [id number] Procedure:             Upper GI endoscopy Indications:           Dysphagia Providers:             Toney Reil MD, MD Referring MD:          Elsworth Soho (Referring MD) Medicines:             General Anesthesia Complications:         No immediate complications. Estimated blood loss: None. Procedure:             Pre-Anesthesia Assessment:                        - Prior to the procedure, a History and Physical was                         performed, and patient medications and allergies were                         reviewed. The patient is competent. The risks and                         benefits of the procedure and the sedation options and                         risks were discussed with the patient. All questions                         were answered and informed consent was obtained.                         Patient identification and proposed procedure were                         verified by the physician, the nurse, the                         anesthesiologist, the anesthetist and the technician                         in the pre-procedure area in the procedure room in the                         endoscopy suite. Mental Status Examination: alert and                         oriented. Airway Examination: normal oropharyngeal                         airway and neck mobility. Respiratory Examination:                         clear to auscultation. CV Examination: normal.  Prophylactic Antibiotics: The patient does not require                         prophylactic antibiotics. Prior Anticoagulants: The                         patient has taken no  anticoagulant or antiplatelet                         agents. ASA Grade Assessment: II - A patient with mild                         systemic disease. After reviewing the risks and                         benefits, the patient was deemed in satisfactory                         condition to undergo the procedure. The anesthesia                         plan was to use general anesthesia. Immediately prior                         to administration of medications, the patient was                         re-assessed for adequacy to receive sedatives. The                         heart rate, respiratory rate, oxygen saturations,                         blood pressure, adequacy of pulmonary ventilation, and                         response to care were monitored throughout the                         procedure. The physical status of the patient was                         re-assessed after the procedure.                        After obtaining informed consent, the endoscope was                         passed under direct vision. Throughout the procedure,                         the patient's blood pressure, pulse, and oxygen                         saturations were monitored continuously. The Endoscope                         was introduced through the mouth, and advanced to the  second part of duodenum. The upper GI endoscopy was                         accomplished without difficulty. The patient tolerated                         the procedure well. Findings:      The duodenal bulb and second portion of the duodenum were normal.      The entire examined stomach was normal.      Esophagogastric landmarks were identified: the gastroesophageal junction       was found at 40 cm from the incisors.      One benign-appearing, intrinsic mild stenosis was found 40 cm from the       incisors. The stenosis was traversed. A TTS dilator was passed through       the scope. Dilation  with a 11-12-10 mm balloon and a 12-13.5-15 mm       balloon dilator was performed to 15 mm. Impression:            - Normal duodenal bulb and second portion of the                         duodenum.                        - Normal stomach.                        - Esophagogastric landmarks identified.                        - Benign-appearing esophageal stenosis. Dilated.                        - No specimens collected. Recommendation:        - Discharge patient to home (with escort).                        - Resume previous diet today.                        - Continue present medications.                        - Use Protonix (pantoprazole) 40 mg PO daily. Procedure Code(s):     --- Professional ---                        [phone], Esophagogastroduodenoscopy, flexible,                         transoral; with transendoscopic balloon dilation of                         esophagus (less than 30 mm diameter) Diagnosis Code(s):     --- Professional ---                        K22.2, Esophageal obstruction                        R13.10, Dysphagia, unspecified CPT copyright 2022 American  Medical Association. All rights reserved. The codes documented in this report are preliminary and upon coder review may  be revised to meet current compliance requirements. Dr. Libby Maw Toney Reil MD, MD 10/14/2022 9:19:30 AM This report has been signed electronically. Number of Addenda: 0 Note Initiated On: 10/14/2022 8:53 AM Estimated Blood Loss:  Estimated blood loss: none.      [facility]

## 2022-10-14 NOTE — Anesthesia Preprocedure Evaluation (Addendum)
Anesthesia Evaluation  Patient identified by MRN, date of birth, ID band Patient awake    Reviewed: Allergy & Precautions, NPO status , Patient's Chart, lab work & pertinent test results  History of Anesthesia Complications Negative for: history of anesthetic complications  Airway Mallampati: I   Neck ROM: Full    Dental  (+) Upper Dentures, Missing   Pulmonary former smoker (quit 1990)   Pulmonary exam normal breath sounds clear to auscultation       Cardiovascular hypertension, Normal cardiovascular exam Rhythm:Regular Rate:Normal     Neuro/Psych  Headaches HOH    GI/Hepatic negative GI ROS,,,  Endo/Other  diabetes, Type 2  Obesity   Renal/GU negative Renal ROS     Musculoskeletal  (+) Arthritis ,    Abdominal   Peds  Hematology negative hematology ROS (+)   Anesthesia Other Findings   Reproductive/Obstetrics                             Anesthesia Physical Anesthesia Plan  ASA: 2  Anesthesia Plan: General   Post-op Pain Management:    Induction: Intravenous  PONV Risk Score and Plan: 3 and Propofol infusion, TIVA and Treatment may vary due to age or medical condition  Airway Management Planned: Natural Airway  Additional Equipment:   Intra-op Plan:   Post-operative Plan:   Informed Consent: I have reviewed the patients History and Physical, chart, labs and discussed the procedure including the risks, benefits and alternatives for the proposed anesthesia with the patient or authorized representative who has indicated his/her understanding and acceptance.       Plan Discussed with: CRNA  Anesthesia Plan Comments: (LMA/GETA backup discussed.  Patient consented for risks of anesthesia including but not limited to:  - adverse reactions to medications - damage to eyes, teeth, lips or other oral mucosa - nerve damage due to positioning  - sore throat or hoarseness -  damage to heart, brain, nerves, lungs, other parts of body or loss of life  Informed patient about role of CRNA in peri- and intra-operative care.  Patient voiced understanding.)        Anesthesia Quick Evaluation

## 2022-10-14 NOTE — Anesthesia Postprocedure Evaluation (Signed)
Anesthesia Post Note  Patient: Sierra [name]  Procedure(s) Performed: ESOPHAGOGASTRODUODENOSCOPY (EGD) WITH PROPOFOL BALLOON DILATION  Patient location during evaluation: PACU Anesthesia Type: General Level of consciousness: awake and alert, oriented and patient cooperative Pain management: pain level controlled Vital Signs Assessment: post-procedure vital signs reviewed and stable Respiratory status: spontaneous breathing, nonlabored ventilation and respiratory function stable Cardiovascular status: blood pressure returned to baseline and stable Postop Assessment: adequate PO intake Anesthetic complications: no   No notable events documented.   Last Vitals:  Vitals:   10/14/22 0930 10/14/22 0940  BP: (!) 142/71 (!) 173/68  Pulse: 72 70  Resp: 16 (!) 25  Temp:    SpO2: 100% 100%    Last Pain:  Vitals:   10/14/22 0940  TempSrc:   PainSc: 0-No pain                 Reed Breech

## 2022-10-14 NOTE — Transfer of Care (Signed)
Immediate Anesthesia Transfer of Care Note  Patient: BRIYANNA GRINNAN  Procedure(s) Performed: ESOPHAGOGASTRODUODENOSCOPY (EGD) WITH PROPOFOL BALLOON DILATION  Patient Location: PACU  Anesthesia Type:General  Level of Consciousness: drowsy  Airway & Oxygen Therapy: Patient Spontanous Breathing and Patient connected to nasal cannula oxygen  Post-op Assessment: Report given to RN and Post -op Vital signs reviewed and stable  Post vital signs: Reviewed and stable  Last Vitals:  Vitals Value Taken Time  BP    Temp    Pulse 76 10/14/22 0921  Resp 15 10/14/22 0921  SpO2 94 % 10/14/22 0921  Vitals shown include unfiled device data.  Last Pain:  Vitals:   10/14/22 0841  TempSrc: Temporal  PainSc: 0-No pain         Complications: No notable events documented.

## 2022-10-14 NOTE — H&P (Signed)
Arlyss Repress, MD [address]  [address]  [place], [place] [address]  Main: [phone]  Fax: [phone] Pager: [phone]  Primary Care Physician:  Clovis Riley, Elbert Ewings.August Saucer, MD Primary Gastroenterologist:  Dr. Arlyss Repress  Pre-Procedure History & Physical: HPI:  Sierra [name] is a 82 y.o. female is here for an endoscopy.   Past Medical History:  Diagnosis Date   Arthritis    generalized    Bilateral cataracts    Cancer (HCC)    hx of cancer being removed from skin on abdomen    Cervical disc disease 2024   needs to be re-done soon   DDD (degenerative disc disease), cervical    Diabetes mellitus    Fatty liver    Headache(784.0)    hx of migraines    Hearing loss    Right ear   Hypertension     Past Surgical History:  Procedure Laterality Date   ABDOMINAL HYSTERECTOMY     BACK SURGERY     x3- lower back    BREAST LUMPECTOMY     right- benign    CATARACT EXTRACTION, BILATERAL     COLONOSCOPY     x2   HEMORRHOID SURGERY     OTHER SURGICAL HISTORY     large cyst removed from right ovary    OTHER SURGICAL HISTORY     cervical neck surgery x 2    OTHER SURGICAL HISTORY     surgery to remove blood clot from neck    OTHER SURGICAL HISTORY     cancer removed from abdominal skin    SHOULDER CLOSED REDUCTION  06/23/2011   Procedure: CLOSED MANIPULATION SHOULDER;  Surgeon: Jacki Cones, MD;  Location: [facility];  Service: Orthopedics;  Laterality: Right;   SHOULDER OPEN ROTATOR CUFF REPAIR  06/23/2011   Procedure: ROTATOR CUFF REPAIR SHOULDER OPEN;  Surgeon: Jacki Cones, MD;  Location: [facility];  Service: Orthopedics;  Laterality: Right;  Right Shoulder Rotator Cuff Repair with Open Acrominectomy, Closed Manipulation and Graft   TOTAL HIP ARTHROPLASTY Right 11/24/2017   Procedure: TOTAL HIP ARTHROPLASTY ANTERIOR APPROACH;  Surgeon: Samson Frederic, MD;  Location: [facility];  Service: Orthopedics;  Laterality: Right;    Prior to Admission medications   Medication  Sig Start Date End Date Taking? Authorizing Provider  amitriptyline (ELAVIL) 50 MG tablet Take 50 mg by mouth at bedtime.   Yes [provider]  metFORMIN (GLUMETZA) 1000 MG (MOD) 24 hr tablet Take 1,000 mg by mouth 2 (two) times daily.    Yes [provider]  pioglitazone (ACTOS) 15 MG tablet TAKE 1 TABLET BY MOUTH EVERY DAY FOR 30 DAYS   Yes [provider]  propranolol ER (INDERAL LA) 80 MG 24 hr capsule Take 80 mg by mouth daily.    Yes [provider]  rosuvastatin (CRESTOR) 10 MG tablet Take 10 mg by mouth 2 (two) times a week. 10/07/17  Yes [provider]  tiZANidine (ZANAFLEX) 2 MG tablet Take 2 mg by mouth at bedtime.   Yes [provider]  Omega-3 Fatty Acids (FISH OIL) 1000 MG CAPS Take 1,000 mg by mouth daily.    [provider]  vitamin B-12 (CYANOCOBALAMIN) 1000 MCG tablet Take 1,000 mcg by mouth daily.    [provider]    Allergies as of 09/22/2022 - Review Complete 09/22/2022  Allergen Reaction Noted   Betasept surgical scrub [chlorhexidine] Itching 06/23/2011    Family History  Problem Relation Age of  Onset   Hypertension Mother    Cancer Father     Social History   Socioeconomic History   Marital status: Widowed    Spouse name: Not on file   Number of children: Not on file   Years of education: Not on file   Highest education level: Not on file  Occupational History   Not on file  Tobacco Use   Smoking status: Former    Current packs/day: 0.00    Average packs/day: 4.0 packs/day for 30.0 years (120.0 ttl pk-yrs)    Types: Cigarettes    Start date: 02/01/1958    Quit date: 02/02/1988    Years since quitting: 34.7   Smokeless tobacco: Never  Vaping Use   Vaping status: Never Used  Substance and Sexual Activity   Alcohol use: Yes    Comment: occasional glass of wine    Drug use: No   Sexual activity: Not on file  Other Topics Concern   Not on file  Social History Narrative   Not on  file   Social Determinants of Health   Financial Resource Strain: Not on file  Food Insecurity: Not on file  Transportation Needs: Not on file  Physical Activity: Not on file  Stress: Not on file  Social Connections: Not on file  Intimate Partner Violence: Not on file    Review of Systems: See HPI, otherwise negative ROS  Physical Exam: BP (!) 142/71   Pulse 72   Temp 97.6 F (36.4 C) (Temporal)   Resp 16   Ht 5\' 7"  (1.702 m)   Wt 199 lb 12.8 oz (90.6 kg)   SpO2 100%   BMI 31.29 kg/m  General:   Alert,  pleasant and cooperative in NAD Head:  Normocephalic and atraumatic. Neck:  Supple; no masses or thyromegaly. Lungs:  Clear throughout to auscultation.    Heart:  Regular rate and rhythm. Abdomen:  Soft, nontender and nondistended. Normal bowel sounds, without guarding, and without rebound.   Neurologic:  Alert and  oriented x4;  grossly normal neurologically.  Impression/Plan: YIDES ARGENBRIGHT is here for an endoscopy to be performed for dysphagia  Risks, benefits, limitations, and alternatives regarding  endoscopy have been reviewed with the patient.  Questions have been answered.  All parties agreeable.   Lannette Donath, MD  10/14/2022, 9:32 AM

## 2022-10-14 NOTE — Anesthesia Procedure Notes (Signed)
Date/Time: 10/14/2022 8:59 AM  Performed by: Malva Cogan, CRNAPre-anesthesia Checklist: Patient identified, Emergency Drugs available, Suction available, Patient being monitored and Timeout performed Patient Re-evaluated:Patient Re-evaluated prior to induction Oxygen Delivery Method: Nasal cannula Induction Type: IV induction Placement Confirmation: CO2 detector and positive ETCO2

## 2022-10-15 ENCOUNTER — Encounter: Payer: Self-pay | Admitting: Gastroenterology

## 2022-10-19 ENCOUNTER — Telehealth: Payer: Self-pay | Admitting: Gastroenterology

## 2022-10-19 NOTE — Telephone Encounter (Signed)
Patient called in because she wanted to make sure her procedure notes get faxes to Dr. Shon Baton. I called patient and left a voicemail

## 2022-10-20 NOTE — Telephone Encounter (Signed)
Pt needs results faxed to [facility] Dr Shon Baton... Pt is aware that I will get results faxed over of EGD

## 2022-11-17 ENCOUNTER — Other Ambulatory Visit: Payer: Self-pay | Admitting: Family Medicine

## 2022-11-17 DIAGNOSIS — R1030 Lower abdominal pain, unspecified: Secondary | ICD-10-CM

## 2022-11-17 LAB — LAB REPORT - SCANNED
A1c: 7
Creatinine, POC: 75 mg/dL
EGFR: 50
Microalb Creat Ratio: 9.4
Microalbumin, Urine: 0.7

## 2022-11-30 ENCOUNTER — Inpatient Hospital Stay
Admission: RE | Admit: 2022-11-30 | Discharge: 2022-11-30 | Disposition: A | Discharge status: Home / Self Care | Payer: Medicare Other | Source: Ambulatory Visit | Attending: Family Medicine | Admitting: Family Medicine

## 2022-11-30 DIAGNOSIS — R1030 Lower abdominal pain, unspecified: Secondary | ICD-10-CM

## 2022-11-30 MED ORDER — IOPAMIDOL (ISOVUE-300) INJECTION 61%
500.0000 mL | Freq: Once | INTRAVENOUS | Status: AC | PRN
  Administered 2022-11-30: 100 mL via INTRAVENOUS

## 2023-01-10 ENCOUNTER — Ambulatory Visit: Payer: Medicare Other | Attending: Cardiology | Admitting: Cardiology

## 2023-01-10 ENCOUNTER — Encounter: Payer: Self-pay | Admitting: Cardiology

## 2023-01-10 VITALS — BP 152/88 | HR 104 | Resp 16 | Ht 67.0 in | Wt 199.6 lb

## 2023-01-10 DIAGNOSIS — R0609 Other forms of dyspnea: Secondary | ICD-10-CM | POA: Diagnosis not present

## 2023-01-10 DIAGNOSIS — I4729 Other ventricular tachycardia: Secondary | ICD-10-CM | POA: Diagnosis not present

## 2023-01-10 DIAGNOSIS — R002 Palpitations: Secondary | ICD-10-CM | POA: Diagnosis not present

## 2023-01-10 DIAGNOSIS — R0789 Other chest pain: Secondary | ICD-10-CM | POA: Insufficient documentation

## 2023-01-10 MED ORDER — DILTIAZEM HCL ER COATED BEADS 180 MG PO CP24: 180.0000 mg | ORAL_CAPSULE | Freq: Every day | ORAL | 3 refills | Status: DC

## 2023-01-10 NOTE — Progress Notes (Signed)
Cardiology Office Note:  .   Date:  01/10/2023  ID:  Sierra [name], DOB 1940/12/14, MRN [id number] PCP: Sierra [name].Sierra Saucer, MD (Inactive)  Sierra [name] Providers Cardiologist:  Sierra Mainland, MD PCP: Sierra [name], Sierra [name].Sierra Saucer, MD (Inactive)  Chief Complaint  Patient presents with   Chest Pain   New Patient (Initial Visit)      History of Present Illness: .    Sierra [name] is a 82 y.o. female with hypertension, type 2 diabetes mellitus, migraines, referred for NSVT  Patient is here with her daughter today.  Patient's primary complaint at this time is back pain radiating to her legs.  She gets regular injections for the same.  Blood pressure is elevated today, in the setting of at least mild back pain.  The primary reason for her referral is NSVT noted on cardiac monitor placed by PCP.  I do not have the detailed tracings of the monitor are available for my review.  There is a note from the talks about 12 beat nonsustained ventricular tachycardia.  Patient denies any chest pain.  She does report occasional palpitations, lightheadedness and exertional dyspnea.   Vitals:   01/10/23 1440 01/10/23 1450  BP: (!) 185/98 (!) 152/88  Pulse: (!) 103 (!) 104  Resp: 16   SpO2:  97%     ROS:  Review of Systems  Cardiovascular:  Positive for dyspnea on exertion and palpitations. Negative for chest pain, leg swelling and syncope.  Neurological:  Positive for light-headedness.     Studies Reviewed: Marland Kitchen        EKG 01/10/2023: Sinus tachycardia When compared with ECG of 21-Nov-2017 16:06,  rate is faster    Independently interpreted 11/2022: Chol 17, TG 194, HDL 46, LDL 117 HbA1C 7.0% Hb 12.5   Physical Exam:   Physical Exam Vitals and nursing note reviewed.  Constitutional:      General: She is not in acute distress. Neck:     Vascular: No JVD.  Cardiovascular:     Rate and Rhythm: Normal rate and regular rhythm.     Heart sounds: Normal heart sounds. No murmur  heard. Pulmonary:     Effort: Pulmonary effort is normal.     Breath sounds: Normal breath sounds. No wheezing or rales.  Musculoskeletal:     Right lower leg: No edema.     Left lower leg: No edema.      VISIT DIAGNOSES:   ICD-10-CM   1. NSVT (nonsustained ventricular tachycardia) (HCC)  I47.29 EKG 12-Lead    ECHOCARDIOGRAM COMPLETE    NM PET CT CARDIAC PERFUSION MULTI W/ABSOLUTE BLOODFLOW    Cardiac Stress Test: Informed Consent Details: Physician/Practitioner Attestation; Transcribe to consent form and obtain patient signature    2. Exertional dyspnea  R06.09     3. Palpitations  R00.2        ASSESSMENT AND PLAN: .    ISSA DIGERONIMO is a 82 y.o. female with hypertension, type 2 diabetes mellitus, migraines, referred for NSVT  NSVT: Detailed tracings not available for my review.  However, patient was reported to have a 12 beat NSVT.  In the setting of her risk factors for CAD, including hypertension, diabetes, I cannot rule out atypical presentation of coronary artery disease.  Recommend echocardiogram and PET/CT stress test to evaluate for any structural abnormality or ischemia or infarction. Added diltiazem 180 mg daily. Patient will continue propranolol 80 mg daily, which she is on for migraines.  Hypertension: Blood pressure control. Back pain likely  contributing. Added diltiazem as above.  Consider adding ARB given her underlying diabetes, will defer to PCP.  Informed Consent   Shared Decision Making/Informed Consent The risks [chest pain, shortness of breath, cardiac arrhythmias, dizziness, blood pressure fluctuations, myocardial infarction, stroke/transient ischemic attack, nausea, vomiting, allergic reaction, radiation exposure, metallic taste sensation and life-threatening complications (estimated to be 1 in 10,000)], benefits (risk stratification, diagnosing coronary artery disease, treatment guidance) and alternatives of a cardiac PET stress test were discussed in  detail with Sierra [name] and she agrees to proceed.       Meds ordered this encounter  Medications   diltiazem (CARDIZEM CD) 180 MG 24 hr capsule    Sig: Take 1 capsule (180 mg total) by mouth daily.    Dispense:  90 capsule    Refill:  3     F/u in 4 to 6 weeks  Signed, Sierra Negus, MD

## 2023-01-10 NOTE — Patient Instructions (Signed)
Medication Instructions:  Your physician has recommended you make the following change in your medication:   1) START diltiazem (Cardizem) 180 mg daily  *If you need a refill on your cardiac medications before your next appointment, please call your pharmacy*  Lab Work: None ordered today. If you have labs (blood work) drawn today and your tests are completely normal, you will receive your results only by: MyChart Message (if you have MyChart) OR A paper copy in the mail If you have any lab test that is abnormal or we need to change your treatment, we will call you to review the results.  Testing/Procedures: Your physician has requested that you have an echocardiogram. Echocardiography is a painless test that uses sound waves to create images of your heart. It provides your doctor with information about the size and shape of your heart and how well your heart's chambers and valves are working. This procedure takes approximately one hour. There are no restrictions for this procedure. Please do NOT wear cologne, perfume, aftershave, or lotions (deodorant is allowed). Please arrive 15 minutes prior to your appointment time.  Please note: We ask at that you not bring children with you during ultrasound (echo/ vascular) testing. Due to room size and safety concerns, children are not allowed in the ultrasound rooms during exams. Our front office staff cannot provide observation of children in our lobby area while testing is being conducted. An adult accompanying a patient to their appointment will only be allowed in the ultrasound room at the discretion of the ultrasound technician under special circumstances. We apologize for any inconvenience.  Your physician has requested you have a cardiac PET CT stress test. This will be performed at [facility], a scheduler will call you to schedule this appointment. Please see instructions below:     Please report to Radiology at the [facility] Main Entrance 30 minutes early for your test.  [address] [place], [place] [address]  How to Prepare for Your Cardiac PET/CT Stress Test:  Nothing to eat or drink, except water, 3 hours prior to arrival time.  NO caffeine/decaffeinated products, or chocolate 12 hours prior to arrival. (Please note decaffeinated beverages (teas/coffees) still contain caffeine).  If you have caffeine within 12 hours prior, the test will need to be rescheduled.  Medication instructions: Do not take erectile dysfunction medications for 72 hours prior to test (sildenafil, tadalafil) Do not take nitrates (isosorbide mononitrate, Ranexa) the day before or day of test Do not take tamsulosin the day before or morning of test Hold theophylline containing medications for 12 hours. Hold Dipyridamole 48 hours prior to the test.  Diabetic Preparation: If able to eat breakfast prior to 3 hour fasting, you may take all medications, including your insulin. Do not worry if you miss your breakfast dose of insulin - start at your next meal. If you do not eat prior to 3 hour fast-Hold all diabetes (oral and insulin) medications. Patients who wear a continuous glucose monitor MUST remove the device prior to scanning.  You may take your remaining medications with water.  NO perfume, cologne or lotion on chest or abdomen area. FEMALES - Please avoid wearing dresses to this appointment.  Total time is 1 to 2 hours; you may want to bring reading material for the waiting time.  IF YOU THINK YOU MAY BE PREGNANT, OR ARE NURSING PLEASE INFORM THE TECHNOLOGIST.  In preparation for your appointment, medication and supplies will be purchased.  Appointment  availability is limited, so if you need to cancel or reschedule, please call the Radiology Department at [phone] [facility]) OR [phone] [facility]) 24 hours in advance to avoid a cancellation fee of $100.00  What to Expect When you Arrive:  Once you arrive  and check in for your appointment, you will be taken to a preparation room within the Radiology Department.  A technologist or Nurse will obtain your medical history, verify that you are correctly prepped for the exam, and explain the procedure.  Afterwards, an IV will be started in your arm and electrodes will be placed on your skin for EKG monitoring during the stress portion of the exam. Then you will be escorted to the PET/CT scanner.  There, staff will get you positioned on the scanner and obtain a blood pressure and EKG.  During the exam, you will continue to be connected to the EKG and blood pressure machines.  A small, safe amount of a radioactive tracer will be injected in your IV to obtain a series of pictures of your heart along with an injection of a stress agent.    After your Exam:  It is recommended that you eat a meal and drink a caffeinated beverage to counter act any effects of the stress agent.  Drink plenty of fluids for the remainder of the day and urinate frequently for the first couple of hours after the exam.  Your doctor will inform you of your test results within 7-10 business days.  For more information and frequently asked questions, please visit our website: [email or web address]  For questions about your test or how to prepare for your test, please call: Cardiac Imaging Nurse Navigators Office: [phone]   Follow-Up: At [facility], you and your health needs are our priority.  As part of our continuing mission to provide you with exceptional heart care, we have created designated Provider Care Teams.  These Care Teams include your primary Cardiologist (physician) and Advanced Practice Providers (APPs -  Physician Assistants and Nurse Practitioners) who all work together to provide you with the care you need, when you need it.  Your next appointment:   6 week(s)  The format for your next appointment:   In Person  Provider:   Elder Negus,  MD  or Jari Favre, PA-C, Ronie Spies, PA-C, Robin Searing, NP, Jacolyn Reedy, PA-C, Eligha Bridegroom, NP, Tereso Newcomer, PA-C, or Perlie Gold, PA-C

## 2023-01-12 ENCOUNTER — Telehealth: Payer: Self-pay | Admitting: Cardiology

## 2023-01-12 DIAGNOSIS — I4729 Other ventricular tachycardia: Secondary | ICD-10-CM

## 2023-01-12 NOTE — Telephone Encounter (Signed)
Zio patch monitor 14 days 11/17/2022 - 12/01/2022: Dominant rhythm: Sinus. HR 60-134 bpm. Avg HR 79 bpm. 11 episodes of atrial tachycardia, fastest at 164 bpm for 6 beats, longest for 7 beats at 122 bpm.  <1% isolated SVE, couplet/triplets. 1 episode of VT, at 179 bpm for 12 beats (11/20/2022 at 9:27 PM). <% isolated VE, couplets. No atrial fibrillation/atrial flutter/VT/high grade AV block, sinus pause >3sec noted. 4 patient triggered events, 1 correlated to SVE.   I see Zio monitor tracings, personally reviewed by me. 1 episode of ventricular tachycardia, as previously mentioned to the patient.  There are also few very short episodes of rapid heart rate reduction of the heart.  Management recommendations same as mentioned to the patient at the visit earlier this week.

## 2023-01-12 NOTE — Telephone Encounter (Signed)
Patwardhan, Anabel Bene, MD  You17 minutes ago (8:49 AM)    Please let the patient know that I reviewed monitor tracings.  Management recommendations stay the same as mentioned to her at the office visit.    The patient has been notified of the result and verbalized understanding.  All questions (if any) were answered. Loa Socks, LPN 16/11/9602 5:40 AM

## 2023-02-01 IMAGING — CR DG CHEST 2V
2 series · 2 of 2 positions shown · non-contrast
Comparison: Chest XR, 03/13/2013.  CT chest, 06/22/2011.

CLINICAL DATA: Dry cough x 1-2 weeks

EXAM:
CHEST - 2 VIEW

[w chest pa]
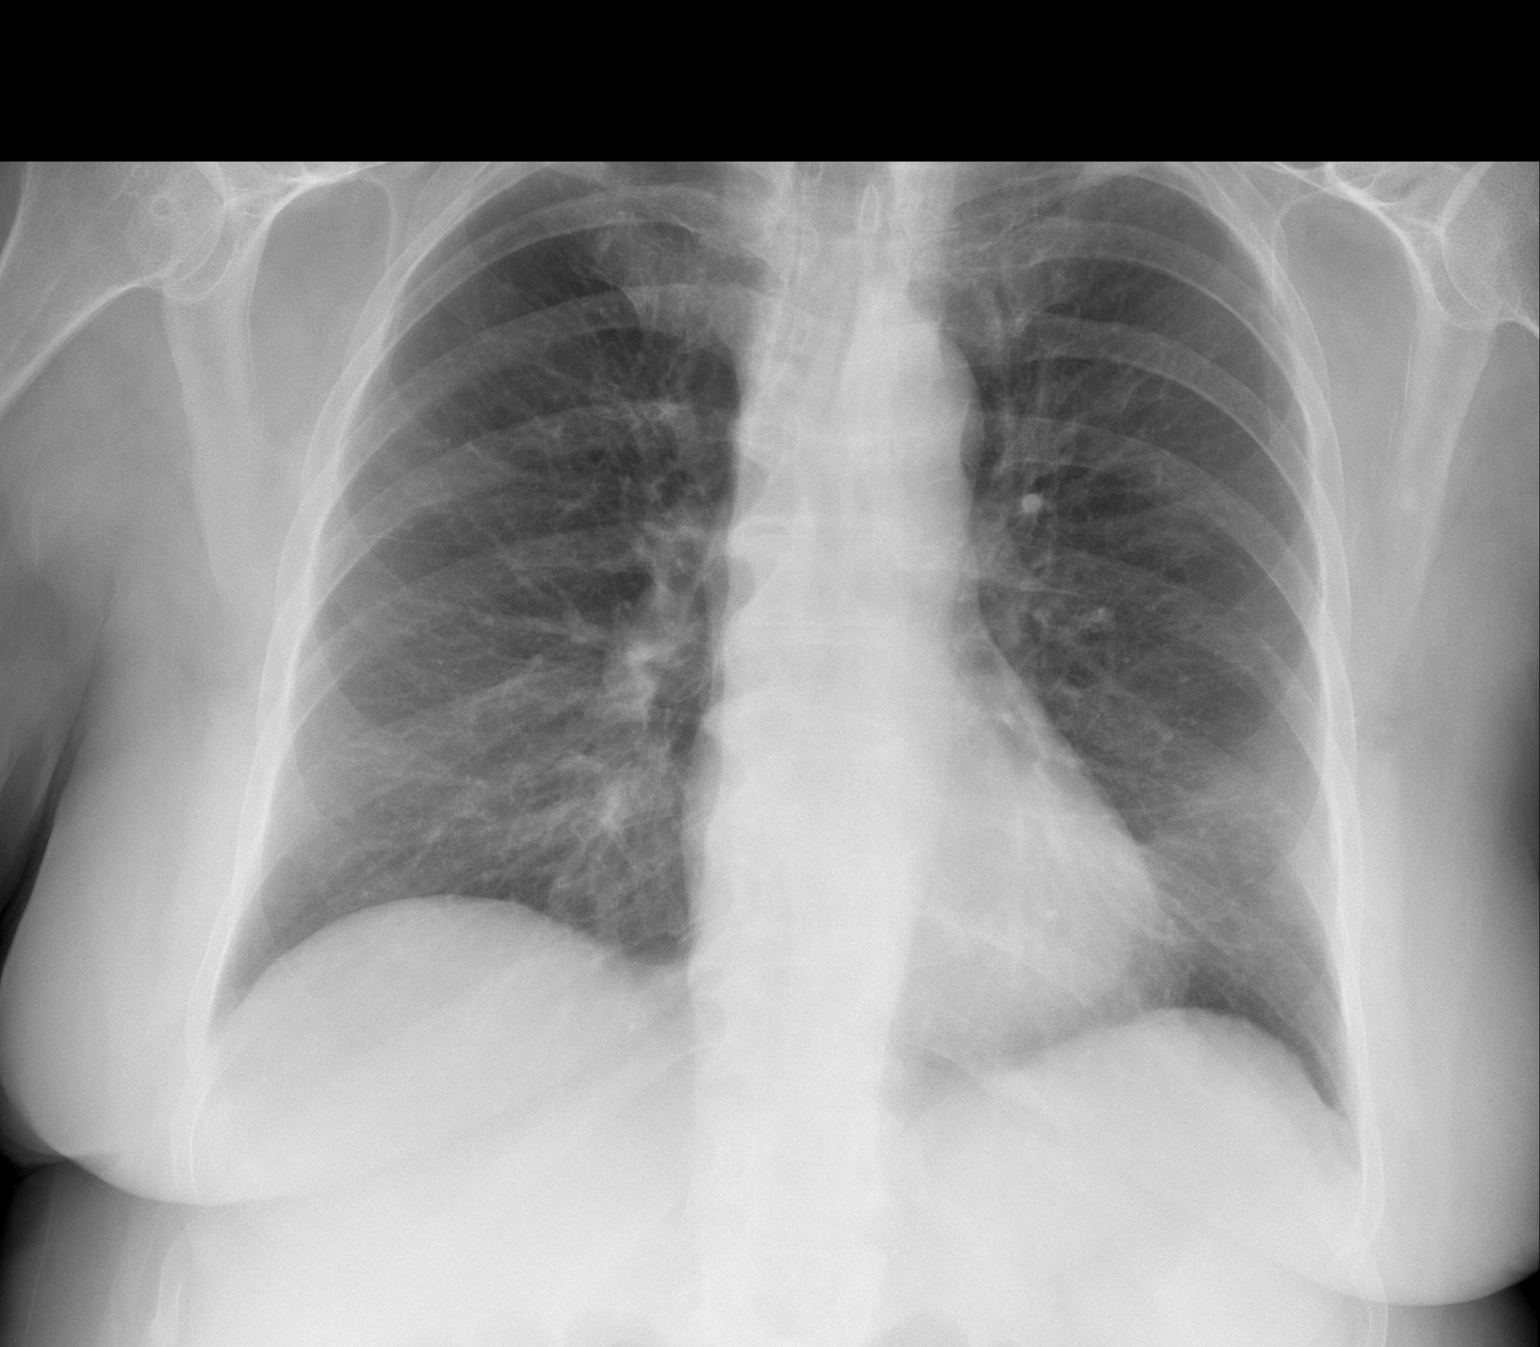

[w chest lat]
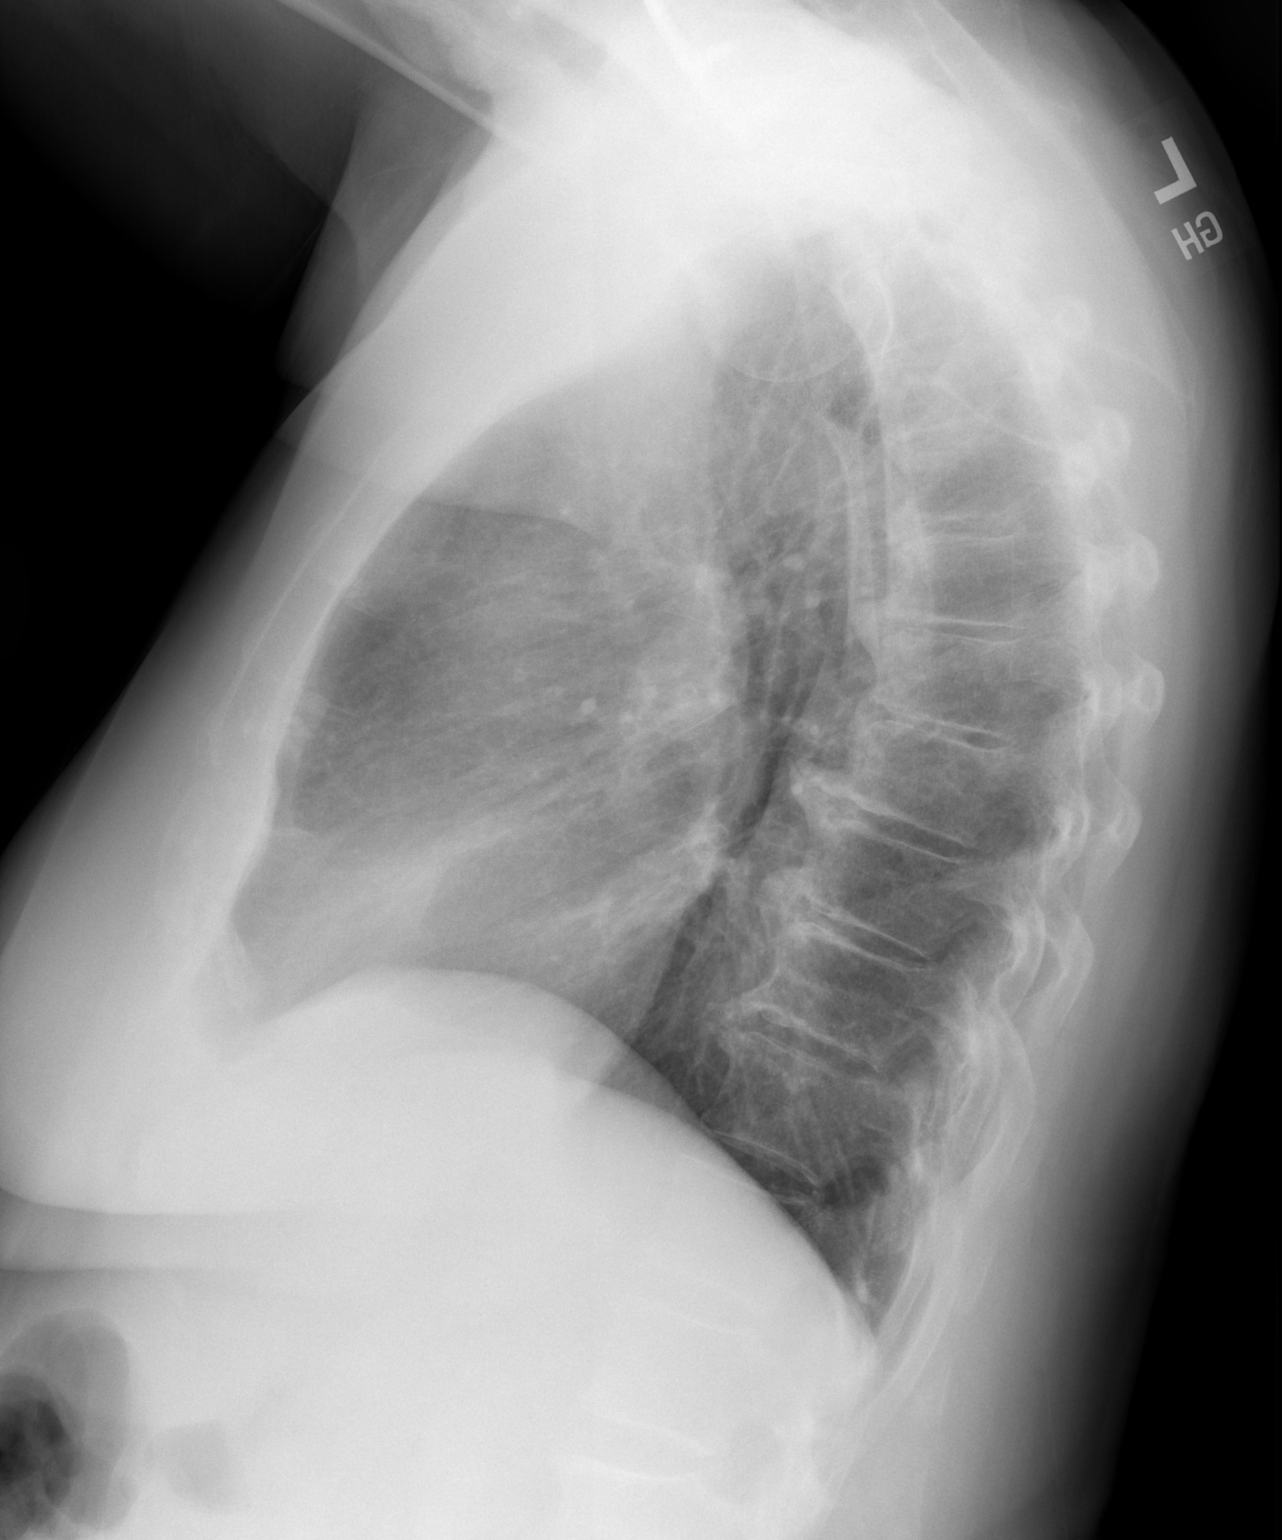

[2 of 2 positions shown; findings below may reference images not displayed]

FINDINGS: Cardiomediastinal silhouette is within normal limits. Lungs are well
inflated. No focal consolidation or mass. No pleural effusion or
pneumothorax. Cervical fusion hardware, incompletely imaged. No
acute displaced fracture.
IMPRESSION: No active cardiopulmonary disease.

## 2023-02-18 ENCOUNTER — Ambulatory Visit (HOSPITAL_COMMUNITY): Payer: Medicare Other | Attending: Cardiology

## 2023-02-18 ENCOUNTER — Encounter: Payer: Self-pay | Admitting: Cardiology

## 2023-02-18 DIAGNOSIS — E119 Type 2 diabetes mellitus without complications: Secondary | ICD-10-CM | POA: Diagnosis not present

## 2023-02-18 DIAGNOSIS — I422 Other hypertrophic cardiomyopathy: Secondary | ICD-10-CM | POA: Diagnosis not present

## 2023-02-18 DIAGNOSIS — I4729 Other ventricular tachycardia: Secondary | ICD-10-CM | POA: Diagnosis present

## 2023-02-18 DIAGNOSIS — I080 Rheumatic disorders of both mitral and aortic valves: Secondary | ICD-10-CM | POA: Insufficient documentation

## 2023-02-18 DIAGNOSIS — I10 Essential (primary) hypertension: Secondary | ICD-10-CM | POA: Insufficient documentation

## 2023-02-18 LAB — ECHOCARDIOGRAM COMPLETE
Area-P 1/2: 3.77 cm2
P 1/2 time: 363 ms
S' Lateral: 2.9 cm

## 2023-02-23 ENCOUNTER — Ambulatory Visit: Payer: Medicare Other | Admitting: Physician Assistant

## 2023-03-07 ENCOUNTER — Encounter (HOSPITAL_COMMUNITY): Payer: Self-pay

## 2023-03-07 NOTE — Progress Notes (Signed)
 Cardiology Office Note:  .   Date:  03/08/2023  ID:  Sierra [name], DOB 1940/10/11, MRN [id number] PCP: Leonel Cole, MD  [facility] Providers Cardiologist:  Newman JINNY Lawrence, MD {  History of Present Illness: .   Sierra [name] is a 83 y.o. female with a past medical history of hypertension, type 2 diabetes mellitus, migraines, who was originally referred for NSVT back in December 2024.  Patient was seen then and her primary complaint was back pain radiating to her legs.  She gets regular injections.  Blood pressure was elevated in the setting of at least mild back pain.  Primary reason for referral was NSVT noted on cardiac monitor placed by PCP.  Unfortunately no detailed tracings of the monitor were available to view.  There was of no which talked about a 12 beat nonsustained ventricular tachycardia.  Patient denied any chest pain.  She did report occasional palpitations, lightheadedness, and exertional dyspnea.  She was started on diltiazem  180 mg daily and continued on propranolol  80 mg daily (for migraines).  Since atypical presentation of coronary artery disease could not be ruled out a PET/CT stress test was ordered.  Today, she presents with a history of heart issues for a follow-up visit. She has been experiencing shortness of breath for about a year, which she attributes to age. The patient has been on diltiazem  for blood pressure control, which she reports has been effective. However, she has noticed an increase in leg swelling since starting the medication. The patient has a history of smoking but quit in 1990.  The patient also has high LDL cholesterol and has been on Crestor  twice a week. However, she reports experiencing flu-like symptoms and joint pain with the medication. The patient has tried multiple statins with similar side effects. Despite the side effects, the patient has continued with Crestor  due to the lack of alternatives.  She had an echo done a couple of weeks  ago, which showed normal pumping function and no severe heart valve abnormalities. However, there was mild leakage of the mitral valve.  Reports no chest pain, pressure, or tightness. No edema, orthopnea, PND. Reports no palpitations.   Discussed the use of AI scribe software for clinical note transcription with the patient, who gave verbal consent to proceed.  ROS: Pertinent ROS in HPI  Studies Reviewed: .       Echocardiogram 02/18/2023 IMPRESSIONS     1. Left ventricular ejection fraction, by estimation, is 60 to 65%. The  left ventricle has normal function. The left ventricle has no regional  wall motion abnormalities. There is mild left ventricular hypertrophy.  Left ventricular diastolic parameters  were normal.   2. Right ventricular systolic function is normal. The right ventricular  size is normal. There is normal pulmonary artery systolic pressure. The  estimated right ventricular systolic pressure is 22.7 mmHg.   3. The mitral valve is normal in structure. Mild mitral valve  regurgitation.   4. The aortic valve is tricuspid. Aortic valve regurgitation is mild. No  aortic stenosis is present.   5. The inferior vena cava is normal in size with greater than 50%  respiratory variability, suggesting right atrial pressure of 3 mmHg.   FINDINGS   Left Ventricle: Left ventricular ejection fraction, by estimation, is 60  to 65%. The left ventricle has normal function. The left ventricle has no  regional wall motion abnormalities. 3D ejection fraction reviewed and  evaluated as part of the  interpretation. Alternate  measurement of EF is felt to be most reflective  of LV function. The left ventricular internal cavity size was normal in  size. There is mild left ventricular hypertrophy. Left ventricular  diastolic parameters were normal.   Right Ventricle: The right ventricular size is normal. No increase in  right ventricular wall thickness. Right ventricular systolic function  is  normal. There is normal pulmonary artery systolic pressure. The tricuspid  regurgitant velocity is 2.22 m/s, and   with an assumed right atrial pressure of 3 mmHg, the estimated right  ventricular systolic pressure is 22.7 mmHg.   Left Atrium: Left atrial size was normal in size.   Right Atrium: Right atrial size was normal in size.   Pericardium: There is no evidence of pericardial effusion.   Mitral Valve: The mitral valve is normal in structure. Mild mitral valve  regurgitation.   Tricuspid Valve: The tricuspid valve is normal in structure. Tricuspid  valve regurgitation is trivial.   Aortic Valve: The aortic valve is tricuspid. Aortic valve regurgitation is  mild. Aortic regurgitation PHT measures 363 msec. No aortic stenosis is  present.   Pulmonic Valve: The pulmonic valve was not well visualized. Pulmonic valve  regurgitation is trivial.   Aorta: The aortic root and ascending aorta are structurally normal, with  no evidence of dilitation.   Venous: The inferior vena cava is normal in size with greater than 50%  respiratory variability, suggesting right atrial pressure of 3 mmHg.   IAS/Shunts: The interatrial septum was not well visualized.       Physical Exam:   VS:  BP (!) 148/70   Pulse 72   Ht 5' 7 (1.702 m)   Wt 205 lb (93 kg)   SpO2 96%   BMI 32.11 kg/m    Wt Readings from Last 3 Encounters:  03/08/23 205 lb (93 kg)  01/10/23 199 lb 9.6 oz (90.5 kg)  10/14/22 199 lb 12.8 oz (90.6 kg)    GEN: Well nourished, well developed in no acute distress NECK: No JVD; No carotid bruits CARDIAC: RRR, no murmurs, rubs, gallops RESPIRATORY:  Clear to auscultation without rales, wheezing or rhonchi  ABDOMEN: Soft, non-tender, non-distended EXTREMITIES:  No edema; No deformity   ASSESSMENT AND PLAN: .   Supraventricular Tachycardia (SVT) Episodes of fast heart rate, more noticeable at rest. Improvement noted with Diltiazem . -Continue Diltiazem  at current  dose.  Hypertension Previously uncontrolled with readings as high as 185/98. Improvement noted with Diltiazem . -Continue Diltiazem  at current dose.  Hyperlipidemia LDL slightly elevated at 117. Patient has intolerance to statins, experiencing flu-like symptoms and joint pain. -Consider switching from Crestor  to non-statin options such as Repatha  or Praluent. Will reach out to prior authorization team to initiate process.  Shortness of Breath/DOE Ongoing for about a year, more noticeable at rest. No cardiac cause identified on echocardiogram. -Await results of PET stress test to rule out cardiac cause. If normal, consider pulmonary or age-related causes.  Lower Extremity Edema Noted to be worse since starting Diltiazem , a known side effect of calcium  channel blockers. -Consider custom compression stockings from [employer]. Provide patient with contact information for [employer].  Follow-up PET stress test scheduled for 03/09/2023 at 9:20 AM.       Dispo: She can follow-up with Dr. Elmira in a few months  Signed, Orren LOISE Fabry, PA-C

## 2023-03-08 ENCOUNTER — Encounter: Payer: Self-pay | Admitting: Physician Assistant

## 2023-03-08 ENCOUNTER — Ambulatory Visit: Payer: Medicare Other | Attending: Physician Assistant | Admitting: Physician Assistant

## 2023-03-08 VITALS — BP 148/70 | HR 72 | Ht 67.0 in | Wt 205.0 lb

## 2023-03-08 DIAGNOSIS — R0609 Other forms of dyspnea: Secondary | ICD-10-CM

## 2023-03-08 DIAGNOSIS — I10 Essential (primary) hypertension: Secondary | ICD-10-CM | POA: Diagnosis not present

## 2023-03-08 DIAGNOSIS — R002 Palpitations: Secondary | ICD-10-CM | POA: Diagnosis not present

## 2023-03-08 DIAGNOSIS — I4729 Other ventricular tachycardia: Secondary | ICD-10-CM

## 2023-03-08 NOTE — Patient Instructions (Signed)
Medication Instructions:  Your physician recommends that you continue on your current medications as directed. Please refer to the Current Medication list given to you today.  *If you need a refill on your cardiac medications before your next appointment, please call your pharmacy*  Lab Work: None ordered today.  Testing/Procedures: None ordered today.  Follow-Up: At [facility], you and your health needs are our priority.  As part of our continuing mission to provide you with exceptional heart care, we have created designated Provider Care Teams.  These Care Teams include your primary Cardiologist (physician) and Advanced Practice Providers (APPs -  Physician Assistants and Nurse Practitioners) who all work together to provide you with the care you need, when you need it.  Your next appointment:   3-4 month(s)  Provider:   Elder Negus, MD   Other Instructions   Support hose [employer] [address] [place] [address] [phone]  [email or web address]   You have been referred to our Pharmacist for Repatha. A scheduler will call you to schedule an appointment to meet with them.     1st Floor: - Lobby - Registration  - Pharmacy  - Lab - Cafe  2nd Floor: - PV Lab - Diagnostic Testing (echo, CT, nuclear med)  3rd Floor: - Vacant  4th Floor: - TCTS (cardiothoracic surgery) - AFib Clinic - Structural Heart Clinic - Vascular Surgery  - Vascular Ultrasound  5th Floor: - HeartCare Cardiology (general and EP) - Clinical Pharmacy for coumadin, hypertension, lipid, weight-loss medications, and med management appointments    Valet parking services will be available as well.

## 2023-03-09 ENCOUNTER — Telehealth: Payer: Self-pay | Admitting: Pharmacy Technician

## 2023-03-09 ENCOUNTER — Encounter (HOSPITAL_COMMUNITY)
Admission: RE | Admit: 2023-03-09 | Discharge: 2023-03-09 | Disposition: A | Discharge status: Home / Self Care | Payer: Medicare Other | Source: Ambulatory Visit | Attending: Cardiology | Admitting: Cardiology

## 2023-03-09 ENCOUNTER — Other Ambulatory Visit (HOSPITAL_COMMUNITY): Payer: Self-pay

## 2023-03-09 ENCOUNTER — Encounter: Payer: Self-pay | Admitting: Cardiology

## 2023-03-09 DIAGNOSIS — I700 Atherosclerosis of aorta: Secondary | ICD-10-CM | POA: Diagnosis not present

## 2023-03-09 DIAGNOSIS — Z136 Encounter for screening for cardiovascular disorders: Secondary | ICD-10-CM | POA: Insufficient documentation

## 2023-03-09 DIAGNOSIS — I2510 Atherosclerotic heart disease of native coronary artery without angina pectoris: Secondary | ICD-10-CM | POA: Insufficient documentation

## 2023-03-09 DIAGNOSIS — I4729 Other ventricular tachycardia: Secondary | ICD-10-CM | POA: Insufficient documentation

## 2023-03-09 LAB — NM PET CT CARDIAC PERFUSION MULTI W/ABSOLUTE BLOODFLOW
LV dias vol: 93 mL (ref 46–106)
LV sys vol: 41 mL
MBFR: 2.28
Nuc Rest EF: 56 %
Nuc Stress EF: 66 %
Rest MBF: 0.69 ml/g/min
Rest Nuclear Isotope Dose: 24.1 mCi
ST Depression (mm): 0 mm
Stress MBF: 1.57 ml/g/min
Stress Nuclear Isotope Dose: 24.1 mCi

## 2023-03-09 MED ORDER — RUBIDIUM RB82 GENERATOR (RUBYFILL)
24.0500 | PACK | Freq: Once | INTRAVENOUS | Status: AC
  Administered 2023-03-09: 24.05 via INTRAVENOUS

## 2023-03-09 MED ORDER — RUBIDIUM RB82 GENERATOR (RUBYFILL)
24.0600 | PACK | Freq: Once | INTRAVENOUS | Status: AC
  Administered 2023-03-09: 24.06 via INTRAVENOUS

## 2023-03-09 MED ORDER — REGADENOSON 0.4 MG/5ML IV SOLN
0.4000 mg | Freq: Once | INTRAVENOUS | Status: AC
  Administered 2023-03-09: 0.4 mg via INTRAVENOUS

## 2023-03-09 MED ORDER — REGADENOSON 0.4 MG/5ML IV SOLN
INTRAVENOUS | Status: AC
  Filled 2023-03-09: qty 5

## 2023-03-09 NOTE — Telephone Encounter (Signed)
 Pharmacy Patient Advocate Encounter  Received notification from [facility] that Prior Authorization for repatha  has been APPROVED from 03/09/23 to 09/06/23. Ran test claim, Copay is $13.00 one month. This test claim was processed through [facility]- copay amounts may vary at other pharmacies due to pharmacy/plan contracts, or as the patient moves through the different stages of their insurance plan.   PA #/Case ID/Reference #: [id number]

## 2023-03-09 NOTE — Telephone Encounter (Signed)
 Pharmacy Patient Advocate Encounter   Received notification from Physician's Office that prior authorization for repatha  is required/requested.   Insurance verification completed.   The patient is insured through [facility] .   Per test claim: PA required; PA submitted to above mentioned insurance via CoverMyMeds Key/confirmation #/EOC [id number] Status is pending

## 2023-03-09 NOTE — Telephone Encounter (Signed)
-----   Message from Von Grumbling sent at 03/08/2023  4:59 PM EST ----- We wanted to start her on Repatha  today and wanted to get the PA process going. She has tried "all the statins" with SE. Thanks!  Von Grumbling, PA-C

## 2023-03-22 MED ORDER — REPATHA SURECLICK 140 MG/ML ~~LOC~~ SOAJ: 1.0000 mL | SUBCUTANEOUS | 11 refills | Status: DC

## 2023-03-22 NOTE — Telephone Encounter (Signed)
Spoke with patient. She is comfortable with injection. We reviewed it over the phone. Will cancel her apt with pharmd.  Rx sent to pharmacy

## 2023-03-22 NOTE — Addendum Note (Signed)
Addended by: Malena Peer D on: 03/22/2023 11:47 AM   Modules accepted: Orders

## 2023-04-25 ENCOUNTER — Ambulatory Visit: Payer: Medicare Other

## 2023-07-06 ENCOUNTER — Telehealth: Payer: Self-pay | Admitting: Radiology

## 2023-07-06 ENCOUNTER — Ambulatory Visit: Payer: Medicare Other | Attending: Cardiology | Admitting: Cardiology

## 2023-07-06 ENCOUNTER — Encounter: Payer: Self-pay | Admitting: Cardiology

## 2023-07-06 VITALS — BP 168/69 | HR 69 | Ht 67.0 in | Wt 201.8 lb

## 2023-07-06 DIAGNOSIS — I2510 Atherosclerotic heart disease of native coronary artery without angina pectoris: Secondary | ICD-10-CM | POA: Insufficient documentation

## 2023-07-06 DIAGNOSIS — R0683 Snoring: Secondary | ICD-10-CM | POA: Diagnosis not present

## 2023-07-06 DIAGNOSIS — E782 Mixed hyperlipidemia: Secondary | ICD-10-CM | POA: Diagnosis not present

## 2023-07-06 DIAGNOSIS — I4729 Other ventricular tachycardia: Secondary | ICD-10-CM

## 2023-07-06 MED ORDER — DILTIAZEM HCL ER COATED BEADS 180 MG PO CP24: 180.0000 mg | ORAL_CAPSULE | Freq: Every day | ORAL | 1 refills | Status: AC

## 2023-07-06 NOTE — Telephone Encounter (Signed)
 Patient agreement reviewed and signed on 07/06/2023.  WatchPAT issued to patient on 07/06/2023 by Nathalie Baize. Patient aware to not open the WatchPAT box until contacted with the activation PIN. Patient profile initialized in CloudPAT on 07/06/2023 by Jenise Mixer. Device serial number: [id number]  Please list Reason for Call as Advice Only and type "WatchPAT issued to patient" in the comment box.

## 2023-07-06 NOTE — Progress Notes (Signed)
 Cardiology Office Note:  .   Date:  07/06/2023  ID:  Sierra [name], DOB 1940-04-29, MRN [id number] PCP: Benedetto Brady, MD  [facility] Providers Cardiologist:  Fransico Ivy, MD PCP: Benedetto Brady, MD  Chief Complaint  Patient presents with   Follow-up   NSVT      History of Present Illness: .    Sierra [name] is a 83 y.o. female with hypertension, type 2 diabetes mellitus, migraines, NSVT  Patient is doing well.  She has not had any chest pain, shortness of breath, palpitation symptoms.  She is on Repatha  through lipid clinic, getting repeat lipid panel checked next month.  Blood pressure remains elevated, which she attributes to back pain and neck pain.  She has regular follow-up with the PCP with home blood pressure monitoring.  Reviewed results of echocardiogram and PET CT stress test with the patient, details below.  Vitals:   07/06/23 0941  BP: (!) 168/69  Pulse: 69  SpO2: 94%      ROS:  Review of Systems  Cardiovascular:  Positive for dyspnea on exertion and palpitations. Negative for chest pain, leg swelling and syncope.  Neurological:  Positive for light-headedness.     Studies Reviewed: Sierra [name]        EKG 01/10/2023: Sinus tachycardia When compared with ECG of 21-Nov-2017 16:06,  rate is faster    Independently interpreted 11/2022: Chol 17, TG 194, HDL 46, LDL 117 HbA1C 7.0% Hb 12.5  PET/CT stress test 03/2023:    LV perfusion is normal. There is no evidence of ischemia. There is no evidence of infarction.   Rest left ventricular function is normal. Rest EF: 56%. Stress left ventricular function is normal. Stress EF: 66%. End diastolic cavity size is normal. End systolic cavity size is normal.   Myocardial blood flow was computed to be 0.102ml/g/min at rest and 1.57ml/g/min at stress. Global myocardial blood flow reserve was 2.28 and was normal.   Coronary calcium  was present on the attenuation correction CT images. Mild coronary calcifications were  present. Coronary calcifications were present in the left anterior descending artery and right coronary artery distribution(s).   The study is normal. The study is low risk.    Echocardiogram 02/2023:  1. Left ventricular ejection fraction, by estimation, is 60 to 65%. The  left ventricle has normal function. The left ventricle has no regional  wall motion abnormalities. There is mild left ventricular hypertrophy.  Left ventricular diastolic parameters were normal.   2. Right ventricular systolic function is normal. The right ventricular  size is normal. There is normal pulmonary artery systolic pressure. The  estimated right ventricular systolic pressure is 22.7 mmHg.   3. The mitral valve is normal in structure. Mild mitral valve  regurgitation.   4. The aortic valve is tricuspid. Aortic valve regurgitation is mild. No  aortic stenosis is present.   5. The inferior vena cava is normal in size with greater than 50%  respiratory variability, suggesting right atrial pressure of 3 mmHg.   Zio patch monitor 14 days 11/17/2022 - 12/01/2022: Dominant rhythm: Sinus. HR 60-134 bpm. Avg HR 79 bpm. 11 episodes of atrial tachycardia, fastest at 164 bpm for 6 beats, longest for 7 beats at 122 bpm.  <1% isolated SVE, couplet/triplets. 1 episode of VT, at 179 bpm for 12 beats (11/20/2022 at 9:27 PM). <% isolated VE, couplets. No atrial fibrillation/atrial flutter/VT/high grade AV block, sinus pause >3sec noted. 4 patient triggered events, 1 correlated to SVE.  Physical Exam:   Physical Exam Vitals and nursing note reviewed.  Constitutional:      General: She is not in acute distress. Neck:     Vascular: No JVD.  Cardiovascular:     Rate and Rhythm: Normal rate and regular rhythm.     Heart sounds: Normal heart sounds. No murmur heard. Pulmonary:     Effort: Pulmonary effort is normal.     Breath sounds: Normal breath sounds. No wheezing or rales.  Musculoskeletal:     Right lower leg:  No edema.     Left lower leg: No edema.      VISIT DIAGNOSES:   ICD-10-CM   1. Coronary artery calcification  I25.10     2. NSVT (nonsustained ventricular tachycardia) (HCC)  I47.29     3. Mixed hyperlipidemia  E78.2     4. Snoring  R06.83 Itamar Sleep Study        ASSESSMENT AND PLAN: .    Sierra [name] is a 83 y.o. female with hypertension, type 2 diabetes mellitus, migraines, referred for NSVT  NSVT: 1 episode of NSVT and brief episodes of atrial tachycardia noted on Zio monitor placed by PCP. No symptoms at present. Structurally normal heart, no ischemia Continue diltiazem , and as needed propranolol  that she takes for migraine.  Coronary calcification: Noted on PET/CT stress test, no ischemia noted. LDL elevated, but now on Repatha . Continue Repatha , follow-up lipid panel check with lipid clinic.  Hypertension: Uncontrolled.  She has regular follow-up with PCP including home blood pressure monitoring.  Pain could be contributing, but I think she still needs additional therapy.  With her diabetes, I would recommend losartan.  Defer to PCP. With her snoring and uncontrolled hypertension, I suspect she may have OSA.  Refer to sleep study.    Meds ordered this encounter  Medications   diltiazem  (CARDIZEM  CD) 180 MG 24 hr capsule    Sig: Take 1 capsule (180 mg total) by mouth daily.    Dispense:  90 capsule    Refill:  1     F/u as needed   Signed, Cody Das, MD

## 2023-07-06 NOTE — Patient Instructions (Addendum)
 Testing/Procedures: Sierra [name]  Your physician has recommended that you have a sleep study. This test records several body functions during sleep, including: brain activity, eye movement, oxygen and carbon dioxide blood levels, heart rate and rhythm, breathing rate and rhythm, the flow of air through your mouth and nose, snoring, body muscle movements, and chest and belly movement.   Follow-Up: At [facility], you and your health needs are our priority.  As part of our continuing mission to provide you with exceptional heart care, our providers are all part of one team.  This team includes your primary Cardiologist (physician) and Advanced Practice Providers or APPs (Physician Assistants and Nurse Practitioners) who all work together to provide you with the care you need, when you need it.  Your next appointment:   As needed   Provider:   Cody Das, MD

## 2023-10-26 ENCOUNTER — Telehealth: Payer: Self-pay

## 2023-10-26 NOTE — Telephone Encounter (Signed)
 Ordering provider: Newman Lawrence, MD Associated diagnoses: Snoring WatchPAT PA obtained on 10/26/2023 by Lucie DELENA Ku, CMA. Authorization: No Prior authorization required per [facility] Medicare Patient notified of PIN ([phone]) on 10/26/2023 via Notification Method: MyChart message.  Phone note routed to covering staff for follow-up.

## 2023-11-11 NOTE — Telephone Encounter (Signed)
 Pt contacted and advised pin number again. Pt will work on completed test.

## 2023-11-14 NOTE — Telephone Encounter (Signed)
 Pt called in asking if someone can can go over instructions on how to set up with phone and where does she send it back

## 2023-11-15 ENCOUNTER — Encounter (HOSPITAL_BASED_OUTPATIENT_CLINIC_OR_DEPARTMENT_OTHER): Payer: Self-pay | Admitting: Cardiology

## 2023-11-15 DIAGNOSIS — G4733 Obstructive sleep apnea (adult) (pediatric): Secondary | ICD-10-CM

## 2023-11-15 NOTE — Telephone Encounter (Signed)
 Pt calling again to request help with sending her Itamar devise back. Please advise.

## 2023-11-17 NOTE — Procedures (Signed)
   SLEEP STUDY REPORT Patient Information Study Date: 11/15/2023 Patient Name: Sierra [name] Patient ID: [id number] Birth Date: 20-Nov-1940 Age: 83 Gender: Female BMI: 31.5 (W=200 lb, H=5' 7'') Stopbang: 4 Referring Physician: Newman Lawrence, MD  TEST DESCRIPTION: Home sleep apnea testing was completed using the WatchPat, a Type 1 device, utilizing  peripheral arterial tonometry (PAT), chest movement, actigraphy, pulse oximetry, pulse rate, body position and snore.  AHI was calculated with apnea and hypopnea using valid sleep time as the denominator. RDI includes apneas,  hypopneas, and RERAs. The data acquired and the scoring of sleep and all associated events were performed in  accordance with the recommended standards and specifications as outlined in the AASM Manual for the Scoring of  Sleep and Associated Events 2.2.0 (2015).  FINDINGS:  1. Moderate Obstructive Sleep Apnea with AHI 18.3/hr.   2. No Central Sleep Apnea with pAHIc 0.1/hr.  3. Oxygen desaturations as low as 78%.  4. Moderate snoring was present. O2 sats were < 88% for 14.3 min.  5. Total sleep time was 7 hrs and 26 min.  6. 34.2% of total sleep time was spent in REM sleep.   7. Normal sleep onset latency at 21 min  8. Shortened REM sleep onset latency at 25 min.   9. Total awakenings were 4.  10. Arrhythmia detection: None  DIAGNOSIS:  Moderate Obstructive Sleep Apnea (G47.33) Nocturnal Hypoxemia  RECOMMENDATIONS: 1. Clinical correlation of these findings is necessary. The decision to treat obstructive sleep apnea (OSA) is usually  based on the presence of apnea symptoms or the presence of associated medical conditions such as Hypertension,  Congestive Heart Failure, Atrial Fibrillation or Obesity. The most common symptoms of OSA are snoring, gasping for  breath while sleeping, daytime sleepiness and fatigue.  2. Initiating apnea therapy is recommended given the presence of symptoms and/or associated  conditions.  Recommend proceeding with one of the following:  a. Auto-CPAP therapy with a pressure range of 5-20cm H2O.  b. An oral appliance (OA) that can be obtained from certain dentists with expertise in sleep medicine. These are  primarily of use in non-obese patients with mild and moderate disease.  c. An ENT consultation which may be useful to look for specific causes of obstruction and possible treatment  options.  d. If patient is intolerant to PAP therapy, consider referral to ENT for evaluation for hypoglossal nerve stimulator.  3. Close follow-up is necessary to ensure success with CPAP or oral appliance therapy for maximum benefit . 4. A follow-up oximetry study on CPAP is recommended to assess the adequacy of therapy and determine the need  for supplemental oxygen or the potential need for Bi-level therapy. An arterial blood gas to determine the adequacy of  baseline ventilation and oxygenation should also be considered. 5. Healthy sleep recommendations include: adequate nightly sleep (normal 7-9 hrs/night), avoidance of caffeine after  noon and alcohol  near bedtime, and maintaining a sleep environment that is cool, dark and quiet. 6. Weight loss for overweight patients is recommended. Even modest amounts of weight loss can significantly  improve the severity of sleep apnea. 7. Snoring recommendations include: weight loss where appropriate, side sleeping, and avoidance of alcohol  before  bed. 8. Operation of motor vehicle should not be performed when sleepy.  Signature: Wilbert Bihari, MD; [facility] Diplomat, American Board of Sleep  Medicine Electronically Signed: 11/17/2023 2:36:27 PM

## 2023-11-25 ENCOUNTER — Telehealth: Payer: Self-pay | Admitting: Cardiology

## 2023-11-25 NOTE — Telephone Encounter (Signed)
 Pt is requesting a callback regarding her wanting to discuss cpap. Please advise

## 2023-11-28 ENCOUNTER — Telehealth: Payer: Self-pay | Admitting: Pharmacy Technician

## 2023-11-28 NOTE — Telephone Encounter (Signed)
   Pharmacy Patient Advocate Encounter   Received notification from CoverMyMeds that prior authorization for repatha  is required/requested.   Insurance verification completed.   The patient is insured through [employer].   Per test claim: PA required; PA submitted to above mentioned insurance via Latent Key/confirmation #/EOC [id number] Status is pending

## 2023-11-29 ENCOUNTER — Telehealth: Payer: Self-pay | Admitting: *Deleted

## 2023-11-29 DIAGNOSIS — G4733 Obstructive sleep apnea (adult) (pediatric): Secondary | ICD-10-CM

## 2023-11-29 DIAGNOSIS — R0683 Snoring: Secondary | ICD-10-CM

## 2023-11-29 DIAGNOSIS — I10 Essential (primary) hypertension: Secondary | ICD-10-CM

## 2023-11-29 NOTE — Telephone Encounter (Signed)
-----   Message from Wilbert Bihari sent at 11/17/2023  2:37 PM EDT ----- Please let patient know that they have sleep apnea.  Recommend therapeutic CPAP titration for treatment of patient's sleep disordered breathing.

## 2023-11-29 NOTE — Telephone Encounter (Signed)
 Pharmacy Patient Advocate Encounter  Received notification from [facility] that Prior Authorization for repatha  has been APPROVED from 11/29/23 to 01/31/25   PA #/Case ID/Reference #: [id number]

## 2023-11-29 NOTE — Telephone Encounter (Signed)
 The patient has been notified of the result and verbalized understanding.  All questions (if any) were answered. Joshua Dalton Seip, CMA 11/29/2023 1:45 PM

## 2024-01-03 ENCOUNTER — Encounter: Payer: Self-pay | Admitting: *Deleted

## 2024-01-03 NOTE — Telephone Encounter (Signed)
 This encounter was created in error - please disregard.

## 2024-01-03 NOTE — Telephone Encounter (Signed)
-----   Message from Wilbert Bihari sent at 11/17/2023  2:37 PM EDT ----- Please let patient know that they have sleep apnea.  Recommend therapeutic CPAP titration for treatment of patient's sleep disordered breathing.

## 2024-02-12 ENCOUNTER — Ambulatory Visit (HOSPITAL_BASED_OUTPATIENT_CLINIC_OR_DEPARTMENT_OTHER): Attending: Cardiology | Admitting: Cardiology

## 2024-02-12 DIAGNOSIS — I10 Essential (primary) hypertension: Secondary | ICD-10-CM | POA: Diagnosis not present

## 2024-02-12 DIAGNOSIS — R0683 Snoring: Secondary | ICD-10-CM | POA: Insufficient documentation

## 2024-02-12 DIAGNOSIS — G4733 Obstructive sleep apnea (adult) (pediatric): Secondary | ICD-10-CM | POA: Diagnosis present

## 2024-02-13 NOTE — Procedures (Signed)
" °  Indications for Polysomnography The patient is an 84 year old Female who is 5' 4 and weighs 200.0 lbs. Her BMI equals 34.6.  A full night titration treatment study was performed.  Medication was taken at 9 pm.Inderal  LAActosFish OilMetforminDiltiazemElavilVitamin B-12 Polysomnogram Data A full night polysomnogram recorded the standard physiologic parameters including EEG, EOG, EMG, EKG, nasal and oral airflow.  Respiratory parameters of chest and abdominal movements were recorded with Respiratory Inductance Plethysmography belts.   Oxygen saturation was recorded by pulse oximetry.  Sleep Architecture The total recording time of the polysomnogram was 386.1 minutes.  The total sleep time was 322.0 minutes.  The patient spent 1.2% of total sleep time in Stage N1, 65.7% in Stage N2, 16.0% in Stages N3, and 17.1% in REM.  Sleep latency was 44.8 minutes.   REM latency was 44.5 minutes.  Sleep Efficiency was 83.4%.  Wake after Sleep Onset time was 19.0 minutes.  Titration Summary The patient was titrated at pressures ranging from 8 cm/H20 up to 12 cm/H20. The last pressure used in the study was 12 cm/H20.  Respiratory Events The polysomnogram revealed a presence of 0 obstructive, 4 central, and 0 mixed apneas resulting in an Apnea index of 0.7 events per hour.  There were 33 hypopneas (GreaterEqual to3% desaturation and/or arousal) resulting in an Apnea\Hypopnea Index (AHI  GreaterEqual to3% desaturation and/or arousal) of 6.9 events per hour.  There were 16 hypopneas (GreaterEqual to4% desaturation) resulting in an Apnea\Hypopnea Index (AHI GreaterEqual to4% desaturation) of 3.7 events per hour.  There were 0 Respiratory  Effort Related Arousals resulting in a RERA index of 0 events per hour. The Respiratory Disturbance Index is 6.9 events per hour.  The snore index was 0 events per hour.  Mean oxygen saturation was 95.3%.  The lowest oxygen saturation during sleep was 85.0%.  Time spent LessEqual  to88% oxygen saturation was  minutes ().  Limb Activity There were 0 limb movements recorded  Cardiac Summary The average pulse rate was 72.6 bpm.  The minimum pulse rate was 66.0 bpm while the maximum pulse rate was 93.0 bpm.  Cardiac rhythm was NSR  Diagnosis: Obstructive Sleep Apnea  Recommendations: 1. Recommend a trial of ResMed CPAP at 12cm H2O with heated humidity and small AirFit 20 mask. 2. Close follow-up is necessary to ensure success with CPAP or oral appliance therapy for maximum benefit. 3. A follow-up oximetry study on CPAP is recommended to assess the adequacy of therapy and determine the need for supplemental oxygen or the potential need for Bi-level therapy.  An arterial blood gas to determine the adequacy of baseline ventilation and  oxygenation should also be considered. 4. Healthy sleep recommendations include:  adequate nightly sleep (normal 7-9 hrs/night), avoidance of caffeine after noon and alcohol  near bedtime, and maintaining a sleep environment that is cool, dark and quiet. 5. Weight loss for overweight patients is recommended.  Even modest amounts of weight loss can significantly improve the severity of sleep apnea. 6. Snoring recommendations include:  weight loss where appropriate, side sleeping, and avoidance of alcohol  before bed 7. Operation of motor vehicle should be avoided when sleepy.    This study was personally reviewed and electronically signed by: Shlomo Corning, MD Accredited Board Certified in Sleep Medicine Date/Time: 02/13/2023 1:28PM "

## 2024-02-23 ENCOUNTER — Other Ambulatory Visit: Payer: Self-pay | Admitting: Physician Assistant

## 2024-03-02 ENCOUNTER — Telehealth: Payer: Self-pay | Admitting: *Deleted

## 2024-03-02 NOTE — Telephone Encounter (Signed)
 The patient has been notified of the result and verbalized understanding.  All questions (if any) were answered. Joshua Dalton Seip, CMA 03/02/2024 12:55 PM    Upon patient request DME selection is [facility]. Patient understands he will be contacted by [facility] to set up his cpap. Patient understands to call if [facility] does not contact him with new setup in a timely manner. Patient understands they will be called once confirmation has been received from [facility]/ that they have received their new machine to schedule 10 week follow up appointment.   [facility] notified of new cpap order  Please add to airview Patient was grateful for the call and thanked me.

## 2024-03-02 NOTE — Telephone Encounter (Signed)
-----   Message from Wilbert Bihari, MD sent at 02/13/2024  1:30 PM EST ----- Please let patient know that they had a successful PAP titration and let DME know that orders are in EPIC.  Please set up 6 week OV with me.
